# Patient Record
Sex: Male | Born: 1970 | Race: White | Hispanic: No | Marital: Married
Health system: Southern US, Academic
[De-identification: ages and names within clinical notes are randomized; demographics above are authoritative.]

## PROBLEM LIST (undated history)

## (undated) ENCOUNTER — Encounter: Attending: Sports Medicine | Primary: Sports Medicine

## (undated) ENCOUNTER — Encounter: Payer: BLUE CROSS/BLUE SHIELD | Attending: Cardiovascular Disease | Primary: Cardiovascular Disease

## (undated) ENCOUNTER — Telehealth: Attending: Sports Medicine | Primary: Sports Medicine

## (undated) ENCOUNTER — Ambulatory Visit

## (undated) ENCOUNTER — Encounter

## (undated) ENCOUNTER — Ambulatory Visit: Payer: PRIVATE HEALTH INSURANCE

## (undated) ENCOUNTER — Telehealth

## (undated) ENCOUNTER — Telehealth
Attending: Student in an Organized Health Care Education/Training Program | Primary: Student in an Organized Health Care Education/Training Program

## (undated) ENCOUNTER — Encounter: Attending: Urology | Primary: Urology

## (undated) ENCOUNTER — Encounter: Attending: Clinical | Primary: Clinical

## (undated) ENCOUNTER — Ambulatory Visit: Payer: PRIVATE HEALTH INSURANCE | Attending: Sports Medicine | Primary: Sports Medicine

## (undated) ENCOUNTER — Encounter: Attending: Internal Medicine | Primary: Internal Medicine

## (undated) ENCOUNTER — Encounter: Payer: BLUE CROSS/BLUE SHIELD | Attending: Internal Medicine | Primary: Internal Medicine

## (undated) ENCOUNTER — Telehealth: Attending: Family | Primary: Family

## (undated) ENCOUNTER — Ambulatory Visit: Payer: PRIVATE HEALTH INSURANCE | Attending: Vascular Surgery | Primary: Vascular Surgery

## (undated) ENCOUNTER — Encounter: Payer: BLUE CROSS/BLUE SHIELD | Attending: Sports Medicine | Primary: Sports Medicine

## (undated) ENCOUNTER — Encounter: Attending: Cardiovascular Disease | Primary: Cardiovascular Disease

## (undated) ENCOUNTER — Telehealth: Attending: Surgery | Primary: Surgery

## (undated) ENCOUNTER — Ambulatory Visit
Payer: PRIVATE HEALTH INSURANCE | Attending: Student in an Organized Health Care Education/Training Program | Primary: Student in an Organized Health Care Education/Training Program

## (undated) ENCOUNTER — Ambulatory Visit
Attending: Student in an Organized Health Care Education/Training Program | Primary: Student in an Organized Health Care Education/Training Program

## (undated) ENCOUNTER — Telehealth
Attending: Pharmacist Clinician (PhC)/ Clinical Pharmacy Specialist | Primary: Pharmacist Clinician (PhC)/ Clinical Pharmacy Specialist

## (undated) ENCOUNTER — Telehealth: Attending: Physician Assistant | Primary: Physician Assistant

## (undated) ENCOUNTER — Ambulatory Visit: Payer: BLUE CROSS/BLUE SHIELD | Attending: Sports Medicine | Primary: Sports Medicine

## (undated) ENCOUNTER — Ambulatory Visit: Payer: PRIVATE HEALTH INSURANCE | Attending: Specialist | Primary: Specialist

## (undated) ENCOUNTER — Encounter: Payer: BLUE CROSS/BLUE SHIELD | Attending: Anesthesiology | Primary: Anesthesiology

## (undated) ENCOUNTER — Ambulatory Visit: Payer: BLUE CROSS/BLUE SHIELD

## (undated) ENCOUNTER — Encounter: Attending: Hematology | Primary: Hematology

## (undated) ENCOUNTER — Encounter
Attending: Student in an Organized Health Care Education/Training Program | Primary: Student in an Organized Health Care Education/Training Program

## (undated) ENCOUNTER — Ambulatory Visit: Attending: Pharmacist | Primary: Pharmacist

## (undated) ENCOUNTER — Ambulatory Visit: Payer: BLUE CROSS/BLUE SHIELD | Attending: Anesthesiology | Primary: Anesthesiology

## (undated) ENCOUNTER — Inpatient Hospital Stay: Payer: PRIVATE HEALTH INSURANCE

## (undated) ENCOUNTER — Other Ambulatory Visit: Attending: Clinical | Primary: Clinical

## (undated) ENCOUNTER — Ambulatory Visit: Payer: PRIVATE HEALTH INSURANCE | Attending: Adult Health | Primary: Adult Health

## (undated) ENCOUNTER — Telehealth: Attending: Family Medicine | Primary: Family Medicine

## (undated) ENCOUNTER — Encounter: Attending: Registered" | Primary: Registered"

## (undated) ENCOUNTER — Encounter: Payer: BLUE CROSS/BLUE SHIELD | Attending: Registered" | Primary: Registered"

## (undated) ENCOUNTER — Encounter: Attending: Vascular Surgery | Primary: Vascular Surgery

## (undated) ENCOUNTER — Ambulatory Visit: Payer: Medicaid (Managed Care)

---
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
## 2007-04-30 ENCOUNTER — Ambulatory Visit (HOSPITAL_COMMUNITY): Admission: RE | Admit: 2007-04-30 | Discharge: 2007-05-03 | Payer: Self-pay | Admitting: Neurosurgery

## 2007-12-09 ENCOUNTER — Ambulatory Visit (HOSPITAL_COMMUNITY): Admission: RE | Admit: 2007-12-09 | Discharge: 2007-12-12 | Payer: Self-pay | Admitting: Neurosurgery

## 2008-02-04 ENCOUNTER — Ambulatory Visit (HOSPITAL_COMMUNITY): Admission: RE | Admit: 2008-02-04 | Discharge: 2008-02-04 | Payer: Self-pay | Admitting: Neurosurgery

## 2008-07-21 ENCOUNTER — Encounter: Admission: RE | Admit: 2008-07-21 | Discharge: 2008-07-21 | Payer: Self-pay | Admitting: Neurosurgery

## 2008-11-14 IMAGING — CR DG LUMBAR SPINE 2-3V
1 series · 1 of 1 positions shown · non-contrast
Comparison: none

CLINICAL DATA: L5-S1 microdiscectomy.
 LUMBAR SPINE - 2 VIEW:

[view not recorded]
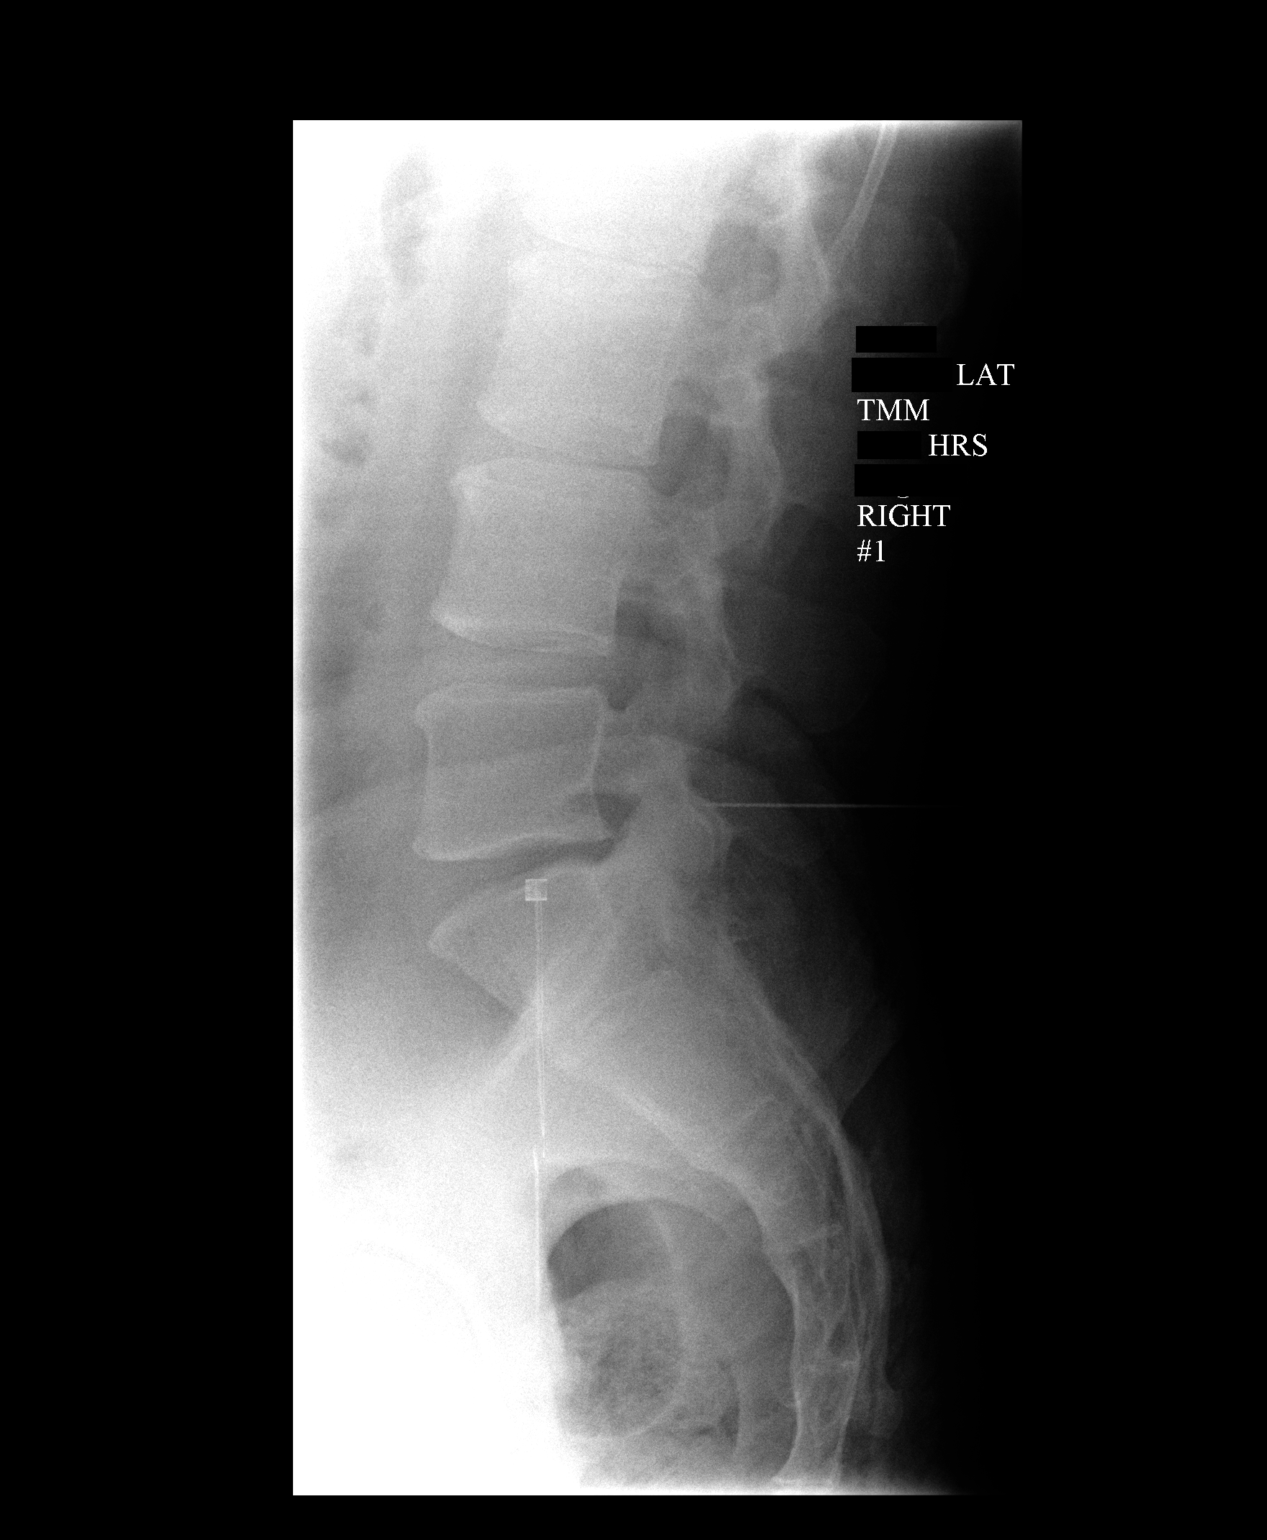

[1 of 1 positions shown; findings below may reference images not displayed]

FINDINGS: Two intraoperative crosstable lateral views of the lumbar spine are submitted. The first film taken at 1212 hours shows surgical probe tip projecting over the L5 inferior facet. Loss of disc space height and mild retrolisthesis of L5-S1. 
 A second film taken at 2791 hours shows a surgical probe tip projecting over the L5 inferior facet.
IMPRESSION: Please see above.

## 2010-03-07 DIAGNOSIS — Z8639 Personal history of other endocrine, nutritional and metabolic disease: Secondary | ICD-10-CM | POA: Insufficient documentation

## 2010-03-07 DIAGNOSIS — E291 Testicular hypofunction: Secondary | ICD-10-CM | POA: Insufficient documentation

## 2010-03-07 DIAGNOSIS — I10 Essential (primary) hypertension: Secondary | ICD-10-CM | POA: Insufficient documentation

## 2010-03-07 DIAGNOSIS — E039 Hypothyroidism, unspecified: Secondary | ICD-10-CM | POA: Insufficient documentation

## 2010-03-07 DIAGNOSIS — K219 Gastro-esophageal reflux disease without esophagitis: Secondary | ICD-10-CM | POA: Insufficient documentation

## 2010-09-10 NOTE — Op Note (Signed)
[name], Manuel Stevenson               ACCOUNT NO.:  [id number]   MEDICAL RECORD NO.:  [id number]          PATIENT TYPE:  AMB   LOCATION:  SDS                          FACILITY:  [facility]   PHYSICIAN:  Reinaldo Meeker, M.D. DATE OF BIRTH:  1970/05/13   DATE OF PROCEDURE:  04/30/2007  DATE OF DISCHARGE:                               OPERATIVE REPORT   PREOPERATIVE DIAGNOSIS:  Herniated disk L5-S1 left.   POSTOPERATIVE DIAGNOSIS:  Herniated disk L5-S1 left.   PROCEDURE:  Left L5-S1 interlaminar laminotomy for excision of herniated  disk with operative microscope.   SECONDARY PROCEDURE:  Microdissection L5-S1 disk and S1 nerve root.   SURGEON:  Reinaldo Meeker, M.D.   ASSISTANT:  Tia Alert, MD   PROCEDURE IN DETAIL:  After being placed in the prone position the  patient's back was prepped and draped in the usual sterile fashion.  Localizing x-rays taken prior to incision to identify the appropriate  level.  Midline incision was made above the spinous processes of L5 and  S1.  Using Bovie cutting current, the incision was carried down the  spinous processes.  Subperiosteal dissection was then carried out on the  left-sided spinous processes and lamina.  Self-retaining retractor was  placed for exposure.  X-rays showed approach to the appropriate level.  Using a high-speed drill the inferior one-third of the L5 laminae and  medial one-third of facet joint and the superior one-third of the S1  lamina were removed.  Residual bone and ligamentum flavum were removed  in a piecemeal fashion.  Microscope was draped and brought in the field  used for remainder of the case.  Using microsection technique, the  lateral aspect of thecal sac and S1 nerve were identified.  Further  coagulation was carried down to floor of canal to identify the L5-S1  disk which was found to be herniated medially to the nerve root.  After  coagulating the annulus, the annulus was incised with a 15 blade.  Using  pituitary rongeurs and curettes thorough disk space clean-out was  carried out.  At same time great care was taken avoid injury to the  neural elements.  This was successfully done.  At this point inspection  was carried out in all directions for any evidence of residual  compression and none could be identified.  Large amounts of irrigation  carried out.  Any bleeding controlled with bipolar coagulation and  Gelfoam.  The wound was then closed in multiple layers of Vicryl in the  muscle, fascia, subcutaneous and subcuticular tissues and staples were  placed on the skin.  A sterile dressing was then applied.  The patient  was extubated, taken to recovery room in stable condition.           ______________________________  Reinaldo Meeker, M.D.     ROK/MEDQ  D:  04/30/2007  T:  04/30/2007  Job:  [id number]

## 2010-09-10 NOTE — Op Note (Signed)
NAMEBREWSTER, [name]:  [id number]   MEDICAL RECORD NO.:  [id number]          PATIENT TYPE:  INP   LOCATION:  3172                         FACILITY:  [facility]   PHYSICIAN:  Reinaldo Meeker, M.D. DATE OF BIRTH:  May 22, 1970   DATE OF PROCEDURE:  12/09/2007  DATE OF DISCHARGE:                               OPERATIVE REPORT   PREOPERATIVE DIAGNOSES:  Herniated disk and degenerative disk disease L5-  S1.   POSTOPERATIVE DIAGNOSES:  Herniated disk and degenerative disk disease  L5-S1.   PROCEDURES:  Bilateral L5-S1 decompressive laminectomy followed by  bilateral microdiskectomy followed by posterior lumbar interbody fusion  L5-S1 with invasive bony spacer and PEEK interbody cage followed by  nonsegmental instrumentation L5-S1 with transfacet screw fixation  followed by posterior fusion L5-S1.   SECONDARY PROCEDURE:  Microdissection L5-S1 disk and S1 nerve roots  bilaterally and intraoperative EMG monitoring.   SURGEON:  Reinaldo Meeker, MD.   ASSISTANT:  Tia Alert, MD   PROCEDURE IN DETAIL:  After placed in the prone position, the patient's  back was prepped and draped in usual sterile fashion.  Previous lumbar  incision was opened up and extended slightly in both directions.  Incision was carried down the spinous processes.  Subperiosteal  dissection was then carried out bilaterally on the spinous processes and  lamina facet joint, and self-retaining retractor was placed for  exposure.  X-ray showed approach to the appropriate level.  Spinous  processes of the interspinous ligament were removed.  Starting on the  patient's right side, laminotomy was performed by removing the inferior  one-half of the L5 lamina and medial one-third of the facet joint and  superior one-half of the S1 lamina.  Residual bone was removed and saved  for use later in the case.  Ligamentum flavum was removed in a piecemeal  fashion.  On the opposite side, edges of the  previous laminotomy were  identified and slightly enlarged to allow access to the left-sided  thecal sac and S1 nerve root.  Dissection was carried out down the floor  of the canal to identify the L5-S1 disk.  This was incised bilaterally  and thoroughly cleaned out with pituitary rongeurs and curettes.  This  was done bilaterally.  At this time, a 10-mm distractor was placed on  the patient's left side.  This was found to be in good height.  On the  opposite side, rotating cutter was used followed by a box chisel with  the 10 x 9 mm chisel and then a 10 x 9 x 25 mm PEEK cage was placed  without difficulty and fluoroscopy showed to be in good position.  Distractor was removed on the opposite side.  On that side, 10-mm  rotating cutter was used.  Prior to placing the second spacer,  autologous bone and OsteoSet Plus were placed in the midline with the  help of the interbody fusion.  On this side, an invasive bony spacer was  placed in an insert and rotate style without difficulty and fluoroscopy  showed to be in excellent position.  At this  time, transfixing screw  fixation was carried out from the patient's right side.  Drill hole was  made followed by passing the drill over the guide.  EMG testing along  with palpation of the medial pedicle, and AP and lateral fluoroscopy  showed no breach of the pedicle.  Tap was then carried out followed by  placing the 35-mm screw into good position.  On the opposite side,  similar procedure was carried out.  At this time, a 38-mm screw was  used.  Once again, an EMG testing was done along with palpation of the  medial pedicle to confirm no breach of the pedicle.  Final fluoroscopy  in AP and lateral direction showed excellent placement of the screws and  interbody devices.  Large amount of irrigation were carried out.  High-  speed was used decorticate the facet joint, autologous bone and OsteoSet  Plus were placed for the posterior fusion.  Gelfoam  was placed around  the thecal sac after irrigation was carried out once more.  Epidural  drain was left in the epidural space and brought out through a separate  stab incision.  The wound was then closed in multiple layers of Vicryl  in the muscle fascia, subcutaneous tissues and staples were placed on  the skin.  A sterile dressing was then applied, and the patient was  extubated and taken to recovery room in stable condition.           ______________________________  Reinaldo Meeker, M.D.     ROK/MEDQ  D:  12/09/2007  T:  12/10/2007  Job:  [id number]

## 2011-01-24 LAB — BASIC METABOLIC PANEL
BUN: 9
CO2: 27
Calcium: 9.4
GFR calc Af Amer: >60
GFR calc non Af Amer: >60
Glucose, Bld: 104 — ABNORMAL HIGH

## 2011-01-24 LAB — CBC
HCT: 46.4
Hemoglobin: 16
MCV: 94
Platelets: 323
RBC: 4.94
RDW: 12.7
WBC: 8.1

## 2011-01-24 LAB — TYPE AND SCREEN

## 2011-01-31 LAB — CBC
Hemoglobin: 15.8
RBC: 4.95
RDW: 12.9

## 2011-01-31 LAB — BASIC METABOLIC PANEL
Calcium: 9.7
GFR calc Af Amer: >60
Potassium: 4.3

## 2011-04-18 DIAGNOSIS — M6740 Ganglion, unspecified site: Secondary | ICD-10-CM | POA: Insufficient documentation

## 2011-04-18 DIAGNOSIS — M19049 Primary osteoarthritis, unspecified hand: Secondary | ICD-10-CM | POA: Insufficient documentation

## 2011-12-12 DIAGNOSIS — L03039 Cellulitis of unspecified toe: Secondary | ICD-10-CM | POA: Insufficient documentation

## 2012-06-07 DIAGNOSIS — G250 Essential tremor: Secondary | ICD-10-CM | POA: Insufficient documentation

## 2012-08-16 DIAGNOSIS — M542 Cervicalgia: Secondary | ICD-10-CM | POA: Insufficient documentation

## 2012-10-20 DIAGNOSIS — D869 Sarcoidosis, unspecified: Secondary | ICD-10-CM | POA: Insufficient documentation

## 2013-03-11 DIAGNOSIS — M961 Postlaminectomy syndrome, not elsewhere classified: Secondary | ICD-10-CM | POA: Insufficient documentation

## 2013-03-18 DIAGNOSIS — N209 Urinary calculus, unspecified: Secondary | ICD-10-CM | POA: Insufficient documentation

## 2013-03-22 DIAGNOSIS — G8929 Other chronic pain: Secondary | ICD-10-CM | POA: Insufficient documentation

## 2013-03-22 DIAGNOSIS — M549 Dorsalgia, unspecified: Secondary | ICD-10-CM

## 2013-06-09 DIAGNOSIS — Z9689 Presence of other specified functional implants: Secondary | ICD-10-CM | POA: Insufficient documentation

## 2013-07-06 DIAGNOSIS — IMO0002 Reserved for concepts with insufficient information to code with codable children: Secondary | ICD-10-CM | POA: Insufficient documentation

## 2013-09-28 DIAGNOSIS — R002 Palpitations: Secondary | ICD-10-CM | POA: Insufficient documentation

## 2014-10-04 DIAGNOSIS — I2510 Atherosclerotic heart disease of native coronary artery without angina pectoris: Secondary | ICD-10-CM | POA: Insufficient documentation

## 2014-10-04 DIAGNOSIS — Z8679 Personal history of other diseases of the circulatory system: Secondary | ICD-10-CM | POA: Insufficient documentation

## 2015-03-25 DIAGNOSIS — J0110 Acute frontal sinusitis, unspecified: Secondary | ICD-10-CM | POA: Insufficient documentation

## 2016-03-18 DIAGNOSIS — N529 Male erectile dysfunction, unspecified: Secondary | ICD-10-CM | POA: Insufficient documentation

## 2016-03-18 DIAGNOSIS — R399 Unspecified symptoms and signs involving the genitourinary system: Secondary | ICD-10-CM | POA: Insufficient documentation

## 2016-11-11 ENCOUNTER — Emergency Department
Admission: EM | Admit: 2016-11-11 | Discharge: 2016-11-11 | Disposition: A | Discharge status: Home / Self Care | Payer: Commercial Managed Care - PPO | Source: Intra-hospital

## 2016-11-11 ENCOUNTER — Emergency Department
Admission: EM | Admit: 2016-11-11 | Discharge: 2016-11-11 | Disposition: A | Discharge status: Home / Self Care | Payer: Commercial Managed Care - PPO | Source: Intra-hospital | Attending: Family Medicine | Admitting: Family Medicine

## 2016-11-11 DIAGNOSIS — R109 Unspecified abdominal pain: Principal | ICD-10-CM

## 2017-01-13 ENCOUNTER — Ambulatory Visit: Admission: RE | Admit: 2017-01-13 | Discharge: 2017-01-13 | Disposition: A | Discharge status: Home / Self Care

## 2017-01-13 DIAGNOSIS — E291 Testicular hypofunction: Principal | ICD-10-CM

## 2017-01-13 DIAGNOSIS — N529 Male erectile dysfunction, unspecified: Secondary | ICD-10-CM

## 2017-01-13 DIAGNOSIS — Z125 Encounter for screening for malignant neoplasm of prostate: Secondary | ICD-10-CM

## 2017-01-13 MED ORDER — TESTOSTERONE CYPIONATE 200 MG/ML INTRAMUSCULAR OIL
INTRAMUSCULAR | 1 refills | 0.00000 days | Status: CP
Start: 2017-01-13 — End: 2017-02-12

## 2017-01-13 MED ORDER — SYRINGE WITH NEEDLE 3 ML 23 GAUGE X 1 1/2"
INJECTION | 11 refills | 0.00000 days | Status: CP
Start: 2017-01-13 — End: 2017-09-15

## 2017-01-27 ENCOUNTER — Ambulatory Visit
Admission: RE | Admit: 2017-01-27 | Discharge: 2017-01-27 | Disposition: A | Discharge status: Home / Self Care | Attending: Sports Medicine | Admitting: Sports Medicine

## 2017-01-27 DIAGNOSIS — G4489 Other headache syndrome: Secondary | ICD-10-CM | POA: Insufficient documentation

## 2017-01-27 DIAGNOSIS — G4733 Obstructive sleep apnea (adult) (pediatric): Secondary | ICD-10-CM | POA: Insufficient documentation

## 2017-01-27 DIAGNOSIS — I2510 Atherosclerotic heart disease of native coronary artery without angina pectoris: Principal | ICD-10-CM

## 2017-01-27 DIAGNOSIS — I10 Essential (primary) hypertension: Secondary | ICD-10-CM

## 2017-01-27 DIAGNOSIS — N529 Male erectile dysfunction, unspecified: Secondary | ICD-10-CM

## 2017-01-27 MED ORDER — SILDENAFIL (PULMONARY HYPERTENSION) 20 MG TABLET
ORAL_TABLET | 5 refills | 0 days | Status: CP
Start: 2017-01-27 — End: 2018-01-01

## 2017-02-03 ENCOUNTER — Ambulatory Visit: Admission: RE | Admit: 2017-02-03 | Discharge: 2017-02-03 | Attending: Sports Medicine

## 2017-02-03 DIAGNOSIS — E7849 Other hyperlipidemia: Secondary | ICD-10-CM | POA: Insufficient documentation

## 2017-02-03 DIAGNOSIS — Z23 Encounter for immunization: Secondary | ICD-10-CM | POA: Insufficient documentation

## 2017-02-03 DIAGNOSIS — I10 Essential (primary) hypertension: Principal | ICD-10-CM

## 2017-02-03 DIAGNOSIS — I2510 Atherosclerotic heart disease of native coronary artery without angina pectoris: Secondary | ICD-10-CM

## 2017-02-03 DIAGNOSIS — E039 Hypothyroidism, unspecified: Secondary | ICD-10-CM

## 2017-02-03 MED ORDER — ATORVASTATIN 20 MG TABLET
ORAL_TABLET | Freq: Every day | ORAL | 5 refills | 0.00000 days | Status: CP
Start: 2017-02-03 — End: 2018-01-01

## 2017-02-03 MED ORDER — HYDROCHLOROTHIAZIDE 25 MG TABLET
ORAL_TABLET | Freq: Every morning | ORAL | 5 refills | 0 days | Status: CP
Start: 2017-02-03 — End: 2017-11-18

## 2017-02-03 MED ORDER — LISINOPRIL 10 MG TABLET
ORAL_TABLET | Freq: Every day | ORAL | 3 refills | 0 days | Status: CP
Start: 2017-02-03 — End: 2018-01-01

## 2017-02-03 MED ORDER — LEVOTHYROXINE 112 MCG TABLET
ORAL_TABLET | Freq: Every day | ORAL | 5 refills | 0 days | Status: CP
Start: 2017-02-03 — End: 2018-02-03

## 2017-02-10 ENCOUNTER — Ambulatory Visit
Admission: RE | Admit: 2017-02-10 | Discharge: 2017-02-10 | Disposition: A | Discharge status: Home / Self Care | Attending: Urology

## 2017-02-10 DIAGNOSIS — N4889 Other specified disorders of penis: Principal | ICD-10-CM

## 2017-04-23 MED ORDER — CARVEDILOL 6.25 MG TABLET
ORAL_TABLET | Freq: Two times a day (BID) | ORAL | 3 refills | 0 days | Status: CP
Start: 2017-04-23 — End: 2018-01-01

## 2017-05-05 ENCOUNTER — Encounter: Admit: 2017-05-05 | Discharge: 2017-05-06 | Payer: BLUE CROSS/BLUE SHIELD

## 2017-05-05 DIAGNOSIS — R7989 Other specified abnormal findings of blood chemistry: Secondary | ICD-10-CM | POA: Insufficient documentation

## 2017-05-05 DIAGNOSIS — N509 Disorder of male genital organs, unspecified: Secondary | ICD-10-CM

## 2017-05-05 MED ORDER — TESTOSTERONE CYPIONATE 200 MG/ML INTRAMUSCULAR OIL
INTRAMUSCULAR | 1 refills | 0.00000 days | Status: CP
Start: 2017-05-05 — End: 2017-09-15

## 2017-05-22 DIAGNOSIS — N5089 Other specified disorders of the male genital organs: Principal | ICD-10-CM

## 2017-05-25 ENCOUNTER — Encounter
Admit: 2017-05-25 | Discharge: 2017-05-25 | Payer: BLUE CROSS/BLUE SHIELD | Attending: Certified Registered" | Primary: Certified Registered"

## 2017-05-25 ENCOUNTER — Encounter: Admit: 2017-05-25 | Discharge: 2017-05-25 | Payer: BLUE CROSS/BLUE SHIELD

## 2017-05-25 DIAGNOSIS — N5089 Other specified disorders of the male genital organs: Principal | ICD-10-CM

## 2017-05-25 MED ORDER — HYDROCODONE 5 MG-ACETAMINOPHEN 325 MG TABLET
ORAL_TABLET | Freq: Four times a day (QID) | ORAL | 0 refills | 0.00000 days | Status: CP | PRN
Start: 2017-05-25 — End: 2017-05-25

## 2017-05-25 MED ORDER — MELOXICAM 7.5 MG TABLET
ORAL_TABLET | Freq: Two times a day (BID) | ORAL | 0 refills | 0.00000 days | Status: CP
Start: 2017-05-25 — End: 2017-05-25

## 2017-05-25 MED ORDER — MELOXICAM 7.5 MG TABLET: 8 mg | tablet | Freq: Two times a day (BID) | 0 refills | 0 days | Status: AC

## 2017-05-25 MED FILL — MELOXICAM/7.5MG/TABS: MELOXICAM/7.5MG/TABS | 14 days supply | Qty: 28 | Fill #0

## 2017-06-17 ENCOUNTER — Encounter: Admit: 2017-06-17 | Discharge: 2017-06-18 | Payer: BLUE CROSS/BLUE SHIELD

## 2017-06-17 DIAGNOSIS — Q5569 Other congenital malformation of penis: Principal | ICD-10-CM

## 2017-09-15 ENCOUNTER — Encounter: Admit: 2017-09-15 | Discharge: 2017-09-16 | Payer: BLUE CROSS/BLUE SHIELD

## 2017-09-15 DIAGNOSIS — R7989 Other specified abnormal findings of blood chemistry: Principal | ICD-10-CM

## 2017-09-15 DIAGNOSIS — E7849 Other hyperlipidemia: Secondary | ICD-10-CM

## 2017-09-15 DIAGNOSIS — E291 Testicular hypofunction: Secondary | ICD-10-CM

## 2017-09-15 DIAGNOSIS — E039 Hypothyroidism, unspecified: Secondary | ICD-10-CM

## 2017-09-15 MED ORDER — SYRINGE WITH NEEDLE 3 ML 23 GAUGE X 1 1/2"
INJECTION | 11 refills | 0 days | Status: CP
Start: 2017-09-15 — End: 2018-09-02

## 2017-09-15 MED ORDER — TESTOSTERONE CYPIONATE 200 MG/ML INTRAMUSCULAR OIL
INTRAMUSCULAR | 1 refills | 0 days | Status: CP
Start: 2017-09-15 — End: 2018-03-16

## 2017-10-01 ENCOUNTER — Encounter: Admit: 2017-10-01 | Discharge: 2017-10-02 | Payer: BLUE CROSS/BLUE SHIELD | Attending: Urology | Primary: Urology

## 2017-10-01 DIAGNOSIS — N360 Urethral fistula: Secondary | ICD-10-CM | POA: Insufficient documentation

## 2017-11-18 ENCOUNTER — Encounter: Admit: 2017-11-18 | Discharge: 2017-11-19 | Payer: BLUE CROSS/BLUE SHIELD

## 2017-11-18 DIAGNOSIS — E785 Hyperlipidemia, unspecified: Secondary | ICD-10-CM

## 2017-11-18 DIAGNOSIS — I2510 Atherosclerotic heart disease of native coronary artery without angina pectoris: Secondary | ICD-10-CM

## 2017-11-18 DIAGNOSIS — I10 Essential (primary) hypertension: Secondary | ICD-10-CM

## 2017-11-18 DIAGNOSIS — Z01818 Encounter for other preprocedural examination: Principal | ICD-10-CM

## 2017-12-01 ENCOUNTER — Encounter
Admit: 2017-12-01 | Discharge: 2017-12-01 | Payer: BLUE CROSS/BLUE SHIELD | Attending: Certified Registered" | Primary: Certified Registered"

## 2017-12-01 ENCOUNTER — Encounter: Admit: 2017-12-01 | Discharge: 2017-12-01 | Payer: BLUE CROSS/BLUE SHIELD

## 2017-12-01 DIAGNOSIS — N360 Urethral fistula: Principal | ICD-10-CM

## 2017-12-01 MED ORDER — OXYCODONE 5 MG TABLET
ORAL_TABLET | ORAL | 0 refills | 0.00000 days | Status: CP | PRN
Start: 2017-12-01 — End: 2017-12-01

## 2017-12-01 MED ORDER — SENNOSIDES 8.6 MG TABLET
ORAL_TABLET | Freq: Every day | ORAL | 0 refills | 0.00000 days | Status: CP
Start: 2017-12-01 — End: 2017-12-31

## 2017-12-01 MED ORDER — TRAMADOL 50 MG TABLET
ORAL_TABLET | Freq: Four times a day (QID) | ORAL | 0 refills | 0 days | Status: CP
Start: 2017-12-01 — End: 2017-12-04

## 2017-12-01 MED ORDER — DOCUSATE SODIUM 100 MG CAPSULE
ORAL_CAPSULE | Freq: Two times a day (BID) | ORAL | 0 refills | 0 days | Status: CP
Start: 2017-12-01 — End: 2017-12-31

## 2017-12-01 MED ORDER — KETOROLAC 10 MG TABLET
ORAL_TABLET | Freq: Four times a day (QID) | ORAL | 0 refills | 0 days | Status: CP | PRN
Start: 2017-12-01 — End: 2017-12-06

## 2017-12-07 ENCOUNTER — Encounter: Admit: 2017-12-07 | Discharge: 2017-12-08 | Payer: BLUE CROSS/BLUE SHIELD

## 2017-12-07 DIAGNOSIS — N360 Urethral fistula: Principal | ICD-10-CM

## 2018-01-01 ENCOUNTER — Encounter
Admit: 2018-01-01 | Discharge: 2018-01-02 | Payer: BLUE CROSS/BLUE SHIELD | Attending: Sports Medicine | Primary: Sports Medicine

## 2018-01-01 DIAGNOSIS — H60501 Unspecified acute noninfective otitis externa, right ear: Secondary | ICD-10-CM | POA: Insufficient documentation

## 2018-01-01 DIAGNOSIS — I2510 Atherosclerotic heart disease of native coronary artery without angina pectoris: Secondary | ICD-10-CM

## 2018-01-01 DIAGNOSIS — N529 Male erectile dysfunction, unspecified: Secondary | ICD-10-CM

## 2018-01-01 DIAGNOSIS — Z23 Encounter for immunization: Secondary | ICD-10-CM

## 2018-01-01 DIAGNOSIS — E7849 Other hyperlipidemia: Principal | ICD-10-CM

## 2018-01-01 MED ORDER — CARVEDILOL 6.25 MG TABLET
ORAL_TABLET | Freq: Two times a day (BID) | ORAL | 3 refills | 0 days | Status: CP
Start: 2018-01-01 — End: ?

## 2018-01-01 MED ORDER — LISINOPRIL 10 MG TABLET
ORAL_TABLET | Freq: Every day | ORAL | 3 refills | 0 days | Status: CP
Start: 2018-01-01 — End: 2018-01-14

## 2018-01-01 MED ORDER — SILDENAFIL (PULMONARY HYPERTENSION) 20 MG TABLET
ORAL_TABLET | 5 refills | 0 days | Status: CP
Start: 2018-01-01 — End: 2018-03-14

## 2018-01-01 MED ORDER — ATORVASTATIN 20 MG TABLET
ORAL_TABLET | Freq: Every day | ORAL | 3 refills | 0 days | Status: CP
Start: 2018-01-01 — End: 2018-01-14

## 2018-01-01 MED ORDER — NEOMYCIN-POLYMYXIN-HYDROCORTISONE OTIC SUSP T-HOME
Freq: Four times a day (QID) | OTIC | 0 refills | 0 days | Status: CP
Start: 2018-01-01 — End: 2018-01-11

## 2018-01-14 ENCOUNTER — Encounter
Admit: 2018-01-14 | Discharge: 2018-01-15 | Payer: BLUE CROSS/BLUE SHIELD | Attending: Internal Medicine | Primary: Internal Medicine

## 2018-01-14 DIAGNOSIS — I209 Angina pectoris, unspecified: Principal | ICD-10-CM

## 2018-01-14 DIAGNOSIS — E785 Hyperlipidemia, unspecified: Secondary | ICD-10-CM

## 2018-01-14 DIAGNOSIS — I10 Essential (primary) hypertension: Secondary | ICD-10-CM

## 2018-01-14 DIAGNOSIS — I2510 Atherosclerotic heart disease of native coronary artery without angina pectoris: Secondary | ICD-10-CM

## 2018-01-14 MED ORDER — ROSUVASTATIN 20 MG TABLET
ORAL_TABLET | Freq: Every day | ORAL | 3 refills | 0.00000 days | Status: CP
Start: 2018-01-14 — End: ?

## 2018-01-14 MED ORDER — LISINOPRIL 10 MG TABLET
ORAL_TABLET | Freq: Two times a day (BID) | ORAL | 3 refills | 0 days | Status: CP
Start: 2018-01-14 — End: ?

## 2018-01-14 MED ORDER — NITROGLYCERIN 0.4 MG SUBLINGUAL TABLET
ORAL_TABLET | SUBLINGUAL | 3 refills | 0.00000 days | Status: CP | PRN
Start: 2018-01-14 — End: 2018-03-14

## 2018-01-15 DIAGNOSIS — I209 Angina pectoris, unspecified: Secondary | ICD-10-CM | POA: Insufficient documentation

## 2018-02-04 ENCOUNTER — Encounter: Admit: 2018-02-04 | Discharge: 2018-02-05 | Payer: BLUE CROSS/BLUE SHIELD

## 2018-02-04 DIAGNOSIS — I2510 Atherosclerotic heart disease of native coronary artery without angina pectoris: Secondary | ICD-10-CM

## 2018-02-04 DIAGNOSIS — I209 Angina pectoris, unspecified: Principal | ICD-10-CM

## 2018-02-12 ENCOUNTER — Ambulatory Visit: Admit: 2018-02-12 | Discharge: 2018-02-12 | Payer: BLUE CROSS/BLUE SHIELD

## 2018-02-12 MED ORDER — ISOSORBIDE MONONITRATE ER 30 MG TABLET,EXTENDED RELEASE 24 HR
ORAL_TABLET | Freq: Every day | ORAL | 1 refills | 0 days | Status: CP
Start: 2018-02-12 — End: 2018-03-14

## 2018-03-08 ENCOUNTER — Encounter: Admit: 2018-03-08 | Discharge: 2018-03-08 | Payer: BLUE CROSS/BLUE SHIELD | Attending: Urology | Primary: Urology

## 2018-03-08 DIAGNOSIS — N360 Urethral fistula: Principal | ICD-10-CM

## 2018-03-14 MED ORDER — SILDENAFIL 100 MG TABLET
ORAL_TABLET | Freq: Every day | ORAL | 11 refills | 0 days | Status: CP | PRN
Start: 2018-03-14 — End: 2019-03-14

## 2018-03-16 ENCOUNTER — Encounter: Admit: 2018-03-16 | Discharge: 2018-03-17 | Payer: BLUE CROSS/BLUE SHIELD

## 2018-03-16 DIAGNOSIS — R7989 Other specified abnormal findings of blood chemistry: Principal | ICD-10-CM

## 2018-03-16 DIAGNOSIS — Z6834 Body mass index (BMI) 34.0-34.9, adult: Secondary | ICD-10-CM

## 2018-03-16 MED ORDER — TADALAFIL 5 MG TABLET
ORAL_TABLET | Freq: Every day | ORAL | 11 refills | 0 days | Status: CP
Start: 2018-03-16 — End: 2018-09-02

## 2018-03-16 MED ORDER — TESTOSTERONE CYPIONATE 200 MG/ML INTRAMUSCULAR OIL
INTRAMUSCULAR | 1 refills | 0 days | Status: CP
Start: 2018-03-16 — End: 2018-09-02

## 2018-08-30 ENCOUNTER — Ambulatory Visit (INDEPENDENT_AMBULATORY_CARE_PROVIDER_SITE_OTHER): Payer: 59 | Admitting: Podiatry

## 2018-08-30 DIAGNOSIS — S92061A Displaced intraarticular fracture of right calcaneus, initial encounter for closed fracture: Secondary | ICD-10-CM

## 2018-08-30 DIAGNOSIS — W11XXXA Fall on and from ladder, initial encounter: Secondary | ICD-10-CM

## 2018-08-30 DIAGNOSIS — M79671 Pain in right foot: Secondary | ICD-10-CM | POA: Diagnosis not present

## 2018-08-30 MED ORDER — ONDANSETRON HCL 4 MG PO TABS: 4.0000 mg | ORAL_TABLET | Freq: Three times a day (TID) | ORAL | 0 refills | Status: DC | PRN

## 2018-08-30 MED ORDER — OXYCODONE HCL 5 MG PO TABS: 5.0000 mg | ORAL_TABLET | ORAL | 0 refills | Status: DC | PRN

## 2018-08-30 NOTE — Progress Notes (Signed)
  Subjective:  Patient ID: Manuel Stevenson, male    DOB: 1970/12/09,  MRN: [id number]  No chief complaint on file.  48 y.o. male presents with the above complaint.  Reports fall from a ladder caused him to land on his foot.  States the injury occurred yesterday.  Was seen at [facility] and told he had a fracture. Underwent a CT scan.  States he fell greater than 12 feet while changing a light bulb.  Review of Systems: Negative except as noted in the HPI. Denies N/V/F/Ch.  No past medical history on file.  Current Outpatient Medications:  .  ondansetron (ZOFRAN) 4 MG tablet, Take 1 tablet (4 mg total) by mouth every 8 (eight) hours as needed for nausea or vomiting., Disp: 20 tablet, Rfl: 0 .  oxyCODONE (ROXICODONE) 5 MG immediate release tablet, Take 1 tablet (5 mg total) by mouth every 4 (four) hours as needed for severe pain., Disp: 20 tablet, Rfl: 0  Social History   Tobacco Use  Smoking Status Not on file    Allergies not on file Objective:  There were no vitals filed for this visit. There is no height or weight on file to calculate BMI. Constitutional Well developed. Well nourished.  Vascular Dorsalis pedis pulses palpable bilaterally. Posterior tibial pulses palpable bilaterally. Capillary refill normal to all digits.  No cyanosis or clubbing noted. Pedal hair growth normal.  Neurologic Normal speech. Oriented to person, place, and time. Epicritic sensation to light touch grossly present bilaterally.  Dermatologic Nails well groomed and normal in appearance. No open wounds. No skin lesions.  Orthopedic:  Pain to palpation about the right calcaneus.  Edema contusion noted to the right heel   Radiographs: Prior CT reviewed comminuted displaced fracture of the right calcaneus.  Moderate displacement no other acute fractures Assessment:   1. Closed displaced intra-articular fracture of right calcaneus, initial encounter   2. Fall from ladder, initial encounter   3.  Pain of right heel    Plan:  Patient was evaluated and treated and all questions answered.  Right calcaneus fracture -CT reviewed. -Discussed with patient operative intervention for this injury.  Discussed risks of the treatment including delayed healing of the fracture, wound healing issues.  Discussed that while surgery is not tended to decrease the risk of motor posttraumatic arthritis, discussed that posttraumatic arthritis is common after this injury even with surgical treatment per the literature. -Knee scooter ordered for safe nonweightbearing to the right lower extremity -Patient has failed all conservative therapy and wishes to proceed with surgical intervention. All risks, benefits, and alternatives discussed with patient. No guarantees given. Consent reviewed and signed by patient. -Planned procedures: ORIF R Calcaneus fracture    45 minutes of face to face time were spent with the patient. >50% of this was spent on counseling and coordination of care. Specifically discussed with patient the above diagnoses and plan for surgery.  No follow-ups on file.

## 2018-08-31 NOTE — Patient Instructions (Signed)
Pre-Operative Instructions  Congratulations, you have decided to take an important step towards improving your quality of life.  You can be assured that the doctors and staff at [facility] will be with you every step of the way.  Here are some important things you should know:  1. Plan to be at the surgery center/hospital at least 1 (one) hour prior to your scheduled time, unless otherwise directed by the surgical center/hospital staff.  You must have a responsible adult accompany you, remain during the surgery and drive you home.  Make sure you have directions to the surgical center/hospital to ensure you arrive on time. 2. If you are having surgery at [facility] or  hospitals, you will need a copy of your medical history and physical form from your family physician within one month prior to the date of surgery. We will give you a form for your primary physician to complete.  3. We make every effort to accommodate the date you request for surgery.  However, there are times where surgery dates or times have to be moved.  We will contact you as soon as possible if a change in schedule is required.   4. No aspirin/ibuprofen for one week before surgery.  If you are on aspirin, any non-steroidal anti-inflammatory medications (Mobic, Aleve, Ibuprofen) should not be taken seven (7) days prior to your surgery.  You make take Tylenol for pain prior to surgery.  5. Medications - If you are taking daily heart and blood pressure medications, seizure, reflux, allergy, asthma, anxiety, pain or diabetes medications, make sure you notify the surgery center/hospital before the day of surgery so they can tell you which medications you should take or avoid the day of surgery. 6. No food or drink after midnight the night before surgery unless directed otherwise by surgical center/hospital staff. 7. No alcoholic beverages 24-hours prior to surgery.  No smoking 24-hours prior or 24-hours after  surgery. 8. Wear loose pants or shorts. They should be loose enough to fit over bandages, boots, and casts. 9. Don't wear slip-on shoes. Sneakers are preferred. 10. Bring your boot with you to the surgery center/hospital.  Also bring crutches or a walker if your physician has prescribed it for you.  If you do not have this equipment, it will be provided for you after surgery. 11. If you have not been contacted by the surgery center/hospital by the day before your surgery, call to confirm the date and time of your surgery. 12. Leave-time from work may vary depending on the type of surgery you have.  Appropriate arrangements should be made prior to surgery with your employer. 13. Prescriptions will be provided immediately following surgery by your doctor.  Fill these as soon as possible after surgery and take the medication as directed. Pain medications will not be refilled on weekends and must be approved by the doctor. 14. Remove nail polish on the operative foot and avoid getting pedicures prior to surgery. 15. Wash the night before surgery.  The night before surgery wash the foot and leg well with water and the antibacterial soap provided. Be sure to pay special attention to beneath the toenails and in between the toes.  Wash for at least three (3) minutes. Rinse thoroughly with water and dry well with a towel.  Perform this wash unless told not to do so by your physician.  Enclosed: 1 Ice pack (please put in freezer the night before surgery)   1 Hibiclens skin cleaner     Pre-op instructions  If you have any questions regarding the instructions, please do not hesitate to call our office.  [place]: [address], [place], [place] [address] -- [phone]  [place]: [address] , [place] [address] -- [phone]  [place]: [address]  [place], [place] [address] -- [phone]  [place]: [address], [address], [place], [place] [address] -- [phone]  Website:  [email or web address] 

## 2018-09-01 ENCOUNTER — Telehealth: Payer: Self-pay | Admitting: *Deleted

## 2018-09-01 NOTE — Telephone Encounter (Signed)
"  I was seen at the [facility] office by Dr. Samuella Cota.  I have got to have foot surgery next Wednesday.  He wants to do it on Wednesday.  He's got to put in metal plates and screws where I crushed my foot in a fall Sunday evening.  If you would, just give me a call back at my home so we can get the surgery scheduled."  (please send paperwork)

## 2018-09-02 ENCOUNTER — Other Ambulatory Visit: Payer: Self-pay | Admitting: *Deleted

## 2018-09-02 ENCOUNTER — Encounter: Admit: 2018-09-02 | Discharge: 2018-09-03 | Payer: BLUE CROSS/BLUE SHIELD

## 2018-09-02 DIAGNOSIS — S92061A Displaced intraarticular fracture of right calcaneus, initial encounter for closed fracture: Secondary | ICD-10-CM

## 2018-09-02 DIAGNOSIS — R7989 Other specified abnormal findings of blood chemistry: Principal | ICD-10-CM

## 2018-09-02 DIAGNOSIS — N529 Male erectile dysfunction, unspecified: Secondary | ICD-10-CM

## 2018-09-02 DIAGNOSIS — E291 Testicular hypofunction: Secondary | ICD-10-CM

## 2018-09-02 MED ORDER — TESTOSTERONE CYPIONATE 200 MG/ML INTRAMUSCULAR OIL
INTRAMUSCULAR | 1 refills | 0 days | Status: CP
Start: 2018-09-02 — End: ?

## 2018-09-02 MED ORDER — SYRINGE WITH NEEDLE 3 ML 23 GAUGE X 1 1/2"
INJECTION | 11 refills | 0 days | Status: CP
Start: 2018-09-02 — End: ?

## 2018-09-02 MED ORDER — TADALAFIL 5 MG TABLET
ORAL_TABLET | Freq: Every day | ORAL | 3 refills | 0 days | Status: CP
Start: 2018-09-02 — End: 2019-08-28

## 2018-09-02 NOTE — Progress Notes (Signed)
Per Dr. Samuella Cota, I placed an order for a knee scooter.  I contacted Georgia Lopes from [facility], so she can assist Manuel Stevenson in getting the knee scooter for the patient.  She will assist once Dr. Samuella Cota signs the order.

## 2018-09-02 NOTE — Telephone Encounter (Signed)
"  I am calling to schedule my surgery.  I crushed my foot on Sunday.  I saw Dr. Samuella Cota on Monday.  He said he would do surgery on my foot on Wednesday of next week."  Let me call you back.  I need to obtain your paperwork.  I am returning your call.  I'm trying to get you scheduled for Wednesday of next week for your surgery.  You will get a call from someone from the surgical center a day or two prior to your surgery date.  "I assume they will give me my appointment time then."  Yes, they will give you your arrival time.  "Do I need to have an appointment with my primary care physician before this surgery?  I was reading it on this paper you gave me."  You do not need a history and physical done prior to your surgery date.  You do need to go online and register with the surgical center.  "Okay, I'll take care of that."

## 2018-09-03 ENCOUNTER — Telehealth: Payer: Self-pay | Admitting: *Deleted

## 2018-09-03 NOTE — Telephone Encounter (Signed)
DOS 09/08/2018, CPT CODE 01720 - ORIF CALCANEUS RIGHT FOOT  [employer]: Effective Date  04/28/2018 -  Termination Date 04/27/9998  Individual In-Network (Calendar Year) Deductible $26.07 MET YTD  $9,106.81 remaining  $3,000.00 Plan Amt.   Out-of-Pocket $156.86 MET YTD  $6,619.69 remaining  $5,000.00 Plan Amt.  Inpatient and Outpatient Facility Visits CO-INSURANCE: 20%   ------------------------------------------------------------------------------------------------------------------------------------------------------------- This [employer] plan does not currently require a prior authorization for these services. If you have general questions about the prior authorization requirements, please call us at [phone] or visit [email or web address] > Clinician Resources > Advance and Admission Notification Requirements. The number above acknowledges your notification. Please write this number down for future reference. Notification is not a guarantee of coverage or payment.  Decision ID #:[id number]  ------------------------------------------------------------------------------------------------------------------------------------------------------------

## 2018-09-08 ENCOUNTER — Other Ambulatory Visit: Payer: Self-pay | Admitting: Podiatry

## 2018-09-08 DIAGNOSIS — S92061A Displaced intraarticular fracture of right calcaneus, initial encounter for closed fracture: Secondary | ICD-10-CM

## 2018-09-08 MED ORDER — ONDANSETRON HCL 4 MG PO TABS: 4.0000 mg | ORAL_TABLET | Freq: Three times a day (TID) | ORAL | 0 refills | Status: DC | PRN

## 2018-09-08 MED ORDER — OXYCODONE-ACETAMINOPHEN 10-325 MG PO TABS: 1.0000 | ORAL_TABLET | ORAL | 0 refills | Status: DC | PRN

## 2018-09-08 MED ORDER — CEPHALEXIN 500 MG PO CAPS: 500.0000 mg | ORAL_CAPSULE | Freq: Two times a day (BID) | ORAL | 0 refills | Status: DC

## 2018-09-09 ENCOUNTER — Telehealth: Payer: Self-pay

## 2018-09-09 ENCOUNTER — Encounter: Payer: Self-pay | Admitting: Podiatry

## 2018-09-09 NOTE — Telephone Encounter (Signed)
POST OP CALL-    1) General condition stated by the patient: Doing very well. Pt states some electrical zapping sensations in toes when trying to flex them on right foot.  2) Is the pt having pain? No  3) Pain score: Still numb from block  4) Has the pt taken Rx'd pain medication, regularly or PRN?   5) Is the pain medication giving relief?  6) Any fever, chills, nausea, or vomiting, shortness of breath or tightness in calf? No  7) Is the bandage clean, dry and intact? Yes.  8) Is there excessive tightness, bleeding or drainage coming through the bandage? No.  9) Did you understand all of the post op instruction sheet given?  10) Any questions or concerns regarding post op care/recovery? No.    Confirmed POV appointment with patient

## 2018-09-13 ENCOUNTER — Other Ambulatory Visit: Payer: Self-pay

## 2018-09-13 DIAGNOSIS — S92001A Unspecified fracture of right calcaneus, initial encounter for closed fracture: Secondary | ICD-10-CM | POA: Insufficient documentation

## 2018-09-13 DIAGNOSIS — W19XXXA Unspecified fall, initial encounter: Secondary | ICD-10-CM | POA: Insufficient documentation

## 2018-09-14 ENCOUNTER — Encounter: Payer: Self-pay | Admitting: Podiatry

## 2018-09-14 ENCOUNTER — Ambulatory Visit (INDEPENDENT_AMBULATORY_CARE_PROVIDER_SITE_OTHER): Payer: 59 | Admitting: Podiatry

## 2018-09-14 ENCOUNTER — Ambulatory Visit (INDEPENDENT_AMBULATORY_CARE_PROVIDER_SITE_OTHER): Payer: 59

## 2018-09-14 ENCOUNTER — Other Ambulatory Visit: Payer: Self-pay

## 2018-09-14 VITALS — BP 161/94 | HR 100 | Temp 97.9°F | Resp 16

## 2018-09-14 DIAGNOSIS — Z9889 Other specified postprocedural states: Secondary | ICD-10-CM | POA: Diagnosis not present

## 2018-09-14 DIAGNOSIS — S92061A Displaced intraarticular fracture of right calcaneus, initial encounter for closed fracture: Secondary | ICD-10-CM

## 2018-09-14 MED ORDER — GABAPENTIN 300 MG PO CAPS: 300.0000 mg | ORAL_CAPSULE | Freq: Three times a day (TID) | ORAL | 0 refills | Status: DC

## 2018-09-14 MED ORDER — OXYCODONE HCL 5 MG PO TABS: 5.0000 mg | ORAL_TABLET | ORAL | 0 refills | Status: AC | PRN

## 2018-09-14 NOTE — Progress Notes (Signed)
Subjective:  Patient ID: Manuel Stevenson, male    DOB: 02/07/1971,  MRN: [id number]  Chief Complaint  Patient presents with  . Routine Post Op    POV#1 DOS 09/08/2018 ORIF CALCANEUS RT Pt. states," 3 nights ago started hurting pretty vad; 10/10 shapr pain." -pt c/o burning and stabbing pain in the midfoot -pt denie sN/V/F/Ch Tx:" crutches, abe, pain meds and elevation     DOS: 09/08/2018 Procedure: R Calcaneus ORIF  48 y.o. male returns for post-op check. Had lots of pain when the nerve block wore off. States the pain feels like a hot knife stabbing him in the foot.  Review of Systems: Negative except as noted in the HPI. Denies N/V/F/Ch.  No past medical history on file.  Current Outpatient Medications:  .  amoxicillin (AMOXIL) 500 MG tablet, amoxicillin 500 mg tablet  Take 2 tablets every 8 hours by oral route as directed for 10 days., Disp: , Rfl:  .  cephALEXin (KEFLEX) 500 MG capsule, Take 1 capsule (500 mg total) by mouth 2 (two) times daily., Disp: 14 capsule, Rfl: 0 .  lisinopril (ZESTRIL) 10 MG tablet, lisinopril 10 mg tablet  Take 1 tablet every day by oral route., Disp: , Rfl:  .  omeprazole (PRILOSEC) 10 MG capsule, omeprazole 10 mg capsule,delayed release  Take 2 capsules every day by oral route., Disp: , Rfl:  .  ondansetron (ZOFRAN) 4 MG tablet, Take 1 tablet (4 mg total) by mouth every 8 (eight) hours as needed for nausea or vomiting., Disp: 20 tablet, Rfl: 0 .  oxyCODONE (ROXICODONE) 5 MG immediate release tablet, Take 1 tablet (5 mg total) by mouth every 4 (four) hours as needed for severe pain., Disp: 20 tablet, Rfl: 0 .  oxyCODONE-acetaminophen (PERCOCET) 10-325 MG tablet, Take 1 tablet by mouth every 4 (four) hours as needed for pain., Disp: 20 tablet, Rfl: 0 .  tadalafil (CIALIS) 5 MG tablet, Take 5 mg by mouth daily., Disp: , Rfl:  .  testosterone cypionate (DEPOTESTOSTERONE CYPIONATE) 200 MG/ML injection, INJECT 0.4 ML INTRAMUSCULARLY ONCE A WEEK, Disp: , Rfl:  .   traMADol (ULTRAM) 50 MG tablet, , Disp: , Rfl:  .  gabapentin (NEURONTIN) 300 MG capsule, Take 1 capsule (300 mg total) by mouth 3 (three) times daily., Disp: 30 capsule, Rfl: 0  Social History   Tobacco Use  Smoking Status Never Smoker  Smokeless Tobacco Never Used    Allergies  Allergen Reactions  . Codeine Other (See Comments) and Nausea And Vomiting   Objective:   Vitals:   09/14/18 1019  BP: (!) 161/94  Pulse: 100  Resp: 16  Temp: 97.9 F (36.6 C)   There is no height or weight on file to calculate BMI. Constitutional Well developed. Well nourished.  Vascular Foot warm and well perfused. Capillary refill normal to all digits.   Neurologic Normal speech. Oriented to person, place, and time. Epicritic sensation to light touch grossly present bilaterally. Diminished over sural nerve distribution but intact at the toes.   Dermatologic Skin healing well without signs of infection. Skin edges well coapted without signs of infection.  Orthopedic: Tenderness to palpation noted about the surgical site.   Radiographs: Taken and reviewed. Acceptable fracture alignment. Hardware appears intact. No evidence of hardware failure. Assessment:   1. Closed displaced intra-articular fracture of right calcaneus, initial encounter    Plan:  Patient was evaluated and treated and all questions answered.  S/p foot surgery right -XR: Reviewed as above. -WB Status:  NWB in right foot -Sutures: Staples intact. -Medications: Refill oxycodone.  -Foot redressed.  Sural Neuritis -Rx gabapentin for neuritis -Discussed neuritis 2/2 variant location of nerve during surgery. Sensation is intact distally so likely neuropraxia that should hopefully resolve with time.  Return in about 1 week (around 09/21/2018) for fracture follow up possible cast .

## 2018-09-15 ENCOUNTER — Other Ambulatory Visit: Payer: Self-pay | Admitting: Podiatry

## 2018-09-15 DIAGNOSIS — Z9889 Other specified postprocedural states: Secondary | ICD-10-CM

## 2018-09-15 DIAGNOSIS — S92061A Displaced intraarticular fracture of right calcaneus, initial encounter for closed fracture: Secondary | ICD-10-CM

## 2018-09-21 ENCOUNTER — Ambulatory Visit (INDEPENDENT_AMBULATORY_CARE_PROVIDER_SITE_OTHER): Payer: 59 | Admitting: Podiatry

## 2018-09-21 ENCOUNTER — Encounter: Payer: Self-pay | Admitting: Podiatry

## 2018-09-21 ENCOUNTER — Other Ambulatory Visit: Payer: Self-pay

## 2018-09-21 VITALS — BP 185/106 | HR 63 | Temp 98.2°F | Resp 16

## 2018-09-21 DIAGNOSIS — S92061A Displaced intraarticular fracture of right calcaneus, initial encounter for closed fracture: Secondary | ICD-10-CM | POA: Diagnosis not present

## 2018-09-21 NOTE — Progress Notes (Signed)
Subjective:  Patient ID: Manuel Stevenson, male    DOB: 1970-11-02,  MRN: [id number]  Chief Complaint  Patient presents with  . Routine Post Op    POV#1 DOS 09/08/2018 ORIF CALCANEUS RT Pt. states," still very painful, it hurts all the time, nothing stops the pain; 6/10 shapr contatn pain." tx: cam boot, oxy (PRN), and gaba -pt denie sN/V?F?Ch     DOS: 09/08/2018 Procedure: R Calcaneus ORIF  48 y.o. male returns for post-op check. History as above. Denies post-op issues.  Review of Systems: Negative except as noted in the HPI. Denies N/V/F/Ch.  No past medical history on file.  Current Outpatient Medications:  .  amoxicillin (AMOXIL) 500 MG tablet, amoxicillin 500 mg tablet  Take 2 tablets every 8 hours by oral route as directed for 10 days., Disp: , Rfl:  .  cephALEXin (KEFLEX) 500 MG capsule, Take 1 capsule (500 mg total) by mouth 2 (two) times daily., Disp: 14 capsule, Rfl: 0 .  gabapentin (NEURONTIN) 300 MG capsule, Take 1 capsule (300 mg total) by mouth 3 (three) times daily., Disp: 30 capsule, Rfl: 0 .  lisinopril (ZESTRIL) 10 MG tablet, lisinopril 10 mg tablet  Take 1 tablet every day by oral route., Disp: , Rfl:  .  omeprazole (PRILOSEC) 10 MG capsule, omeprazole 10 mg capsule,delayed release  Take 2 capsules every day by oral route., Disp: , Rfl:  .  ondansetron (ZOFRAN) 4 MG tablet, Take 1 tablet (4 mg total) by mouth every 8 (eight) hours as needed for nausea or vomiting., Disp: 20 tablet, Rfl: 0 .  oxyCODONE (ROXICODONE) 5 MG immediate release tablet, Take 1 tablet (5 mg total) by mouth every 4 (four) hours as needed for severe pain., Disp: 20 tablet, Rfl: 0 .  oxyCODONE-acetaminophen (PERCOCET) 10-325 MG tablet, Take 1 tablet by mouth every 4 (four) hours as needed for pain., Disp: 20 tablet, Rfl: 0 .  tadalafil (CIALIS) 5 MG tablet, Take 5 mg by mouth daily., Disp: , Rfl:  .  testosterone cypionate (DEPOTESTOSTERONE CYPIONATE) 200 MG/ML injection, INJECT 0.4 ML INTRAMUSCULARLY  ONCE A WEEK, Disp: , Rfl:  .  traMADol (ULTRAM) 50 MG tablet, , Disp: , Rfl:   Social History   Tobacco Use  Smoking Status Never Smoker  Smokeless Tobacco Never Used    Allergies  Allergen Reactions  . Codeine Other (See Comments) and Nausea And Vomiting   Objective:   Vitals:   09/21/18 0824  BP: (!) 185/106  Pulse: 63  Resp: 16  Temp: 98.2 F (36.8 C)   There is no height or weight on file to calculate BMI. Constitutional Well developed. Well nourished.  Vascular Foot warm and well perfused. Capillary refill normal to all digits.   Neurologic Normal speech. Oriented to person, place, and time. Epicritic sensation to light touch grossly present bilaterally. Diminished over sural nerve distribution but increased compared to last exam.  Dermatologic Skin healing well without signs of infection. Skin edges well coapted without signs of infection.  Orthopedic: Tenderness to palpation noted about the surgical site.   Radiographs: None today. Assessment:   1. Closed displaced intra-articular fracture of right calcaneus, initial encounter    Plan:  Patient was evaluated and treated and all questions answered.  S/p foot surgery right -XR: Reviewed as above. -WB Status: NWB in right foot -Sutures: Staples intact. -Short leg cast applied today with 3 fiberglass rolls. Neurovascularly intact s/p application. -Medications: No refill today. Offered increasing pain medication to dilaudid. Patient declined. -  Foot redressed.  Sural Neuritis -Continue gabapentin. -Neuritis improving. Sensation returning.  No follow-ups on file.

## 2018-10-05 ENCOUNTER — Ambulatory Visit (INDEPENDENT_AMBULATORY_CARE_PROVIDER_SITE_OTHER): Payer: 59

## 2018-10-05 ENCOUNTER — Ambulatory Visit (INDEPENDENT_AMBULATORY_CARE_PROVIDER_SITE_OTHER): Payer: 59 | Admitting: Podiatry

## 2018-10-05 ENCOUNTER — Other Ambulatory Visit: Payer: Self-pay

## 2018-10-05 DIAGNOSIS — W11XXXD Fall on and from ladder, subsequent encounter: Secondary | ICD-10-CM | POA: Diagnosis not present

## 2018-10-05 DIAGNOSIS — S92061D Displaced intraarticular fracture of right calcaneus, subsequent encounter for fracture with routine healing: Secondary | ICD-10-CM | POA: Diagnosis not present

## 2018-10-05 DIAGNOSIS — M79671 Pain in right foot: Secondary | ICD-10-CM

## 2018-10-05 MED ORDER — GABAPENTIN 300 MG PO CAPS: 300.0000 mg | ORAL_CAPSULE | Freq: Four times a day (QID) | ORAL | 0 refills | Status: DC

## 2018-10-05 MED ORDER — HYDROCODONE-ACETAMINOPHEN 5-325 MG PO TABS: 1.0000 | ORAL_TABLET | ORAL | 0 refills | Status: DC | PRN

## 2018-10-05 NOTE — Progress Notes (Signed)
Subjective:  Patient ID: Manuel Stevenson, male    DOB: [birth year],  MRN: [id number]  No chief complaint on file.   DOS: 09/08/2018 Procedure: R Calcaneus ORIF  48 y.o. male returns for post-op check. History as above. Having significant burning in hte foot making it difficult for him to sleep.  Has not been taking gabapentin 3 times a day as directed.  Review of Systems: Negative except as noted in the HPI. Denies N/V/F/Ch.  No past medical history on file.  Current Outpatient Medications:  .  amoxicillin (AMOXIL) 500 MG tablet, amoxicillin 500 mg tablet  Take 2 tablets every 8 hours by oral route as directed for 10 days., Disp: , Rfl:  .  cephALEXin (KEFLEX) 500 MG capsule, Take 1 capsule (500 mg total) by mouth 2 (two) times daily., Disp: 14 capsule, Rfl: 0 .  gabapentin (NEURONTIN) 300 MG capsule, Take 1 capsule (300 mg total) by mouth 4 (four) times daily., Disp: 60 capsule, Rfl: 0 .  HYDROcodone-acetaminophen (NORCO) 5-325 MG tablet, Take 1 tablet by mouth every 4 (four) hours as needed for moderate pain., Disp: 12 tablet, Rfl: 0 .  lisinopril (ZESTRIL) 10 MG tablet, lisinopril 10 mg tablet  Take 1 tablet every day by oral route., Disp: , Rfl:  .  omeprazole (PRILOSEC) 10 MG capsule, omeprazole 10 mg capsule,delayed release  Take 2 capsules every day by oral route., Disp: , Rfl:  .  ondansetron (ZOFRAN) 4 MG tablet, Take 1 tablet (4 mg total) by mouth every 8 (eight) hours as needed for nausea or vomiting., Disp: 20 tablet, Rfl: 0 .  oxyCODONE (ROXICODONE) 5 MG immediate release tablet, Take 1 tablet (5 mg total) by mouth every 4 (four) hours as needed for severe pain., Disp: 20 tablet, Rfl: 0 .  oxyCODONE-acetaminophen (PERCOCET) 10-325 MG tablet, Take 1 tablet by mouth every 4 (four) hours as needed for pain., Disp: 20 tablet, Rfl: 0 .  tadalafil (CIALIS) 5 MG tablet, Take 5 mg by mouth daily., Disp: , Rfl:  .  testosterone cypionate (DEPOTESTOSTERONE CYPIONATE) 200 MG/ML injection,  INJECT 0.4 ML INTRAMUSCULARLY ONCE A WEEK, Disp: , Rfl:  .  traMADol (ULTRAM) 50 MG tablet, , Disp: , Rfl:   Social History   Tobacco Use  Smoking Status Never Smoker  Smokeless Tobacco Never Used    Allergies  Allergen Reactions  . Codeine Other (See Comments) and Nausea And Vomiting   Objective:   There were no vitals filed for this visit. There is no height or weight on file to calculate BMI. Constitutional Well developed. Well nourished.  Vascular Foot warm and well perfused. Capillary refill normal to all digits.   Neurologic Normal speech. Oriented to person, place, and time. Epicritic sensation to light touch grossly present bilaterally. Diminished over sural nerve distribution but increased compared to last exam.  Dermatologic Skin healing well without signs of infection. Skin edges well coapted without signs of infection.  Orthopedic: Tenderness to palpation noted about the surgical site.   Radiographs: None today. Assessment:   1. Closed displaced intra-articular fracture of right calcaneus, initial encounter   2. Fall from ladder, initial encounter   3. Pain of right heel    Plan:  Patient was evaluated and treated and all questions answered.  S/p foot surgery right -XR: Reviewed as above. -WB Status: NWB in right foot -Sutures: Staples intact. -Short leg cast removed. Not reapplied today. -Medications:  Rx Norco for pain. -Foot redressed.  Sural Neuritis -Continue gabapentin. Refilled. Increased  dosage today to TID. Advised to take 600 mg at night. -Appears to be improving.  No follow-ups on file.

## 2018-10-12 ENCOUNTER — Encounter: Payer: 59 | Admitting: Podiatry

## 2018-10-12 ENCOUNTER — Other Ambulatory Visit: Payer: Self-pay

## 2018-10-18 ENCOUNTER — Other Ambulatory Visit: Payer: Self-pay

## 2018-10-18 ENCOUNTER — Ambulatory Visit (INDEPENDENT_AMBULATORY_CARE_PROVIDER_SITE_OTHER): Payer: 59 | Admitting: Podiatry

## 2018-10-18 ENCOUNTER — Ambulatory Visit (INDEPENDENT_AMBULATORY_CARE_PROVIDER_SITE_OTHER): Payer: 59

## 2018-10-18 ENCOUNTER — Encounter: Payer: Self-pay | Admitting: Podiatry

## 2018-10-18 VITALS — Temp 99.2°F | Resp 16

## 2018-10-18 DIAGNOSIS — M79671 Pain in right foot: Secondary | ICD-10-CM

## 2018-10-18 DIAGNOSIS — S92061D Displaced intraarticular fracture of right calcaneus, subsequent encounter for fracture with routine healing: Secondary | ICD-10-CM

## 2018-10-18 DIAGNOSIS — W11XXXD Fall on and from ladder, subsequent encounter: Secondary | ICD-10-CM | POA: Diagnosis not present

## 2018-10-18 MED ORDER — TRAMADOL HCL 50 MG PO TABS: 50.0000 mg | ORAL_TABLET | Freq: Four times a day (QID) | ORAL | 0 refills | Status: DC | PRN

## 2018-10-18 NOTE — Progress Notes (Signed)
Subjective:  Patient ID: Manuel Stevenson, male    DOB: 06/24/1970,  MRN: [id number]  Chief Complaint  Patient presents with  . Routine Post Op    DOS 09/08/2018 ORIF CALCANEUS RT -pt states," pain is the same, it hurts every time; 8/10 sharp constatn pain." Tx: boot, crutches, narco, and gaba -pt denies N/V/F?Ch -w/ drainage adn redness    DOS: 09/08/2018 Procedure: R Calcaneus ORIF  48 y.o. male returns for post-op check. History as above. Thinks the nerve is getting a little better but still hurting both up his leg and down his foot. Tramadol helping with his pain.  Review of Systems: Negative except as noted in the HPI. Denies N/V/F/Ch.  No past medical history on file.  Current Outpatient Medications:  .  amoxicillin (AMOXIL) 500 MG tablet, amoxicillin 500 mg tablet  Take 2 tablets every 8 hours by oral route as directed for 10 days., Disp: , Rfl:  .  carvedilol (COREG) 6.25 MG tablet, Take 6.25 mg by mouth 2 (two) times daily., Disp: , Rfl:  .  cephALEXin (KEFLEX) 500 MG capsule, Take 1 capsule (500 mg total) by mouth 2 (two) times daily., Disp: 14 capsule, Rfl: 0 .  gabapentin (NEURONTIN) 300 MG capsule, Take 1 capsule (300 mg total) by mouth 4 (four) times daily., Disp: 60 capsule, Rfl: 0 .  HYDROcodone-acetaminophen (NORCO) 5-325 MG tablet, Take 1 tablet by mouth every 4 (four) hours as needed for moderate pain., Disp: 12 tablet, Rfl: 0 .  lisinopril (ZESTRIL) 10 MG tablet, lisinopril 10 mg tablet  Take 1 tablet every day by oral route., Disp: , Rfl:  .  omeprazole (PRILOSEC) 10 MG capsule, omeprazole 10 mg capsule,delayed release  Take 2 capsules every day by oral route., Disp: , Rfl:  .  ondansetron (ZOFRAN) 4 MG tablet, Take 1 tablet (4 mg total) by mouth every 8 (eight) hours as needed for nausea or vomiting., Disp: 20 tablet, Rfl: 0 .  oxyCODONE (ROXICODONE) 5 MG immediate release tablet, Take 1 tablet (5 mg total) by mouth every 4 (four) hours as needed for severe pain., Disp:  20 tablet, Rfl: 0 .  oxyCODONE-acetaminophen (PERCOCET) 10-325 MG tablet, Take 1 tablet by mouth every 4 (four) hours as needed for pain., Disp: 20 tablet, Rfl: 0 .  tadalafil (CIALIS) 5 MG tablet, Take 5 mg by mouth daily., Disp: , Rfl:  .  testosterone cypionate (DEPOTESTOSTERONE CYPIONATE) 200 MG/ML injection, INJECT 0.4 ML INTRAMUSCULARLY ONCE A WEEK, Disp: , Rfl:  .  traMADol (ULTRAM) 50 MG tablet, Take 1 tablet (50 mg total) by mouth every 6 (six) hours as needed., Disp: 30 tablet, Rfl: 0  Social History   Tobacco Use  Smoking Status Never Smoker  Smokeless Tobacco Never Used    Allergies  Allergen Reactions  . Codeine Other (See Comments) and Nausea And Vomiting   Objective:   Vitals:   10/18/18 1038  Resp: 16  Temp: 99.2 F (37.3 C)   There is no height or weight on file to calculate BMI. Constitutional Well developed. Well nourished.  Vascular Foot warm and well perfused. Capillary refill normal to all digits.   Neurologic Normal speech. Oriented to person, place, and time. Epicritic sensation to light touch grossly present bilaterally. Diminished over sural nerve distribution but increased compared to last exam.  Dermatologic Skin with superficial dehiscence with fibrotic base, no deep dehiscence, no probe to bone, no warmth, no erythema, no signs of acute infection.  Orthopedic: Tenderness to palpation noted  about the surgical site.   Radiographs: Taken and reviewed hardware intact good alignment noted. Assessment:   1. Closed displaced intra-articular fracture of right calcaneus with routine healing, subsequent encounter   2. Fall from ladder, subsequent encounter   3. Pain of right heel    Plan:  Patient was evaluated and treated and all questions answered.  S/p foot surgery right -XR: Reviewed as above. -WB Status: NWB in right foot -Sutures: Staples removed -Dispense iodosorb to be used daily.  -Medications:  Rx Norco for pain. -Foot redressed.   Sural Neuritis -Continue gabapentin -Will take time but appears to be improving.  Return in about 1 week (around 10/25/2018) for Post-op, Right no XRs .

## 2018-10-25 ENCOUNTER — Other Ambulatory Visit: Payer: Self-pay | Admitting: Podiatry

## 2018-10-25 ENCOUNTER — Other Ambulatory Visit: Payer: Self-pay

## 2018-10-25 ENCOUNTER — Ambulatory Visit (INDEPENDENT_AMBULATORY_CARE_PROVIDER_SITE_OTHER): Payer: 59 | Admitting: Podiatry

## 2018-10-25 DIAGNOSIS — Z9889 Other specified postprocedural states: Secondary | ICD-10-CM

## 2018-10-25 DIAGNOSIS — S92061D Displaced intraarticular fracture of right calcaneus, subsequent encounter for fracture with routine healing: Secondary | ICD-10-CM

## 2018-10-25 NOTE — Progress Notes (Signed)
Subjective:  Patient ID: Manuel Stevenson, male    DOB: 05/16/1970,  MRN: [id number]  Chief Complaint  Patient presents with  . Routine Post Op    POV Right Pt. states," I'm stil painful, pain goes up Manuel side of Manuel leg to my hip; 3/10 pain." Tx: tramadil, gaba, boot and crutches     DOS: 09/08/2018 Procedure: R Calcaneus ORIF  48 y.o. male returns for post-op check. Hx as above.  Review of Systems: Negative except as noted in Manuel HPI. Denies N/V/F/Ch.  No past medical history on file.  Current Outpatient Medications:  .  amoxicillin (AMOXIL) 500 MG tablet, amoxicillin 500 mg tablet  Take 2 tablets every 8 hours by oral route as directed for 10 days., Disp: , Rfl:  .  carvedilol (COREG) 6.25 MG tablet, Take 6.25 mg by mouth 2 (two) times daily., Disp: , Rfl:  .  cephALEXin (KEFLEX) 500 MG capsule, Take 1 capsule (500 mg total) by mouth 2 (two) times daily., Disp: 14 capsule, Rfl: 0 .  gabapentin (NEURONTIN) 300 MG capsule, Take 1 capsule (300 mg total) by mouth 4 (four) times daily., Disp: 60 capsule, Rfl: 0 .  HYDROcodone-acetaminophen (NORCO) 5-325 MG tablet, Take 1 tablet by mouth every 4 (four) hours as needed for moderate pain., Disp: 12 tablet, Rfl: 0 .  lisinopril (ZESTRIL) 10 MG tablet, lisinopril 10 mg tablet  Take 1 tablet every day by oral route., Disp: , Rfl:  .  omeprazole (PRILOSEC) 10 MG capsule, omeprazole 10 mg capsule,delayed release  Take 2 capsules every day by oral route., Disp: , Rfl:  .  ondansetron (ZOFRAN) 4 MG tablet, Take 1 tablet (4 mg total) by mouth every 8 (eight) hours as needed for nausea or vomiting., Disp: 20 tablet, Rfl: 0 .  oxyCODONE (ROXICODONE) 5 MG immediate release tablet, Take 1 tablet (5 mg total) by mouth every 4 (four) hours as needed for severe pain., Disp: 20 tablet, Rfl: 0 .  oxyCODONE-acetaminophen (PERCOCET) 10-325 MG tablet, Take 1 tablet by mouth every 4 (four) hours as needed for pain., Disp: 20 tablet, Rfl: 0 .  tadalafil (CIALIS) 5  MG tablet, Take 5 mg by mouth daily., Disp: , Rfl:  .  testosterone cypionate (DEPOTESTOSTERONE CYPIONATE) 200 MG/ML injection, INJECT 0.4 ML INTRAMUSCULARLY ONCE A WEEK, Disp: , Rfl:  .  traMADol (ULTRAM) 50 MG tablet, Take 1 tablet (50 mg total) by mouth every 6 (six) hours as needed., Disp: 30 tablet, Rfl: 0  Social History   Tobacco Use  Smoking Status Never Smoker  Smokeless Tobacco Never Used    Allergies  Allergen Reactions  . Codeine Other (See Comments) and Nausea And Vomiting   Objective:   There were no vitals filed for this visit. There is no height or weight on file to calculate BMI. Constitutional Well developed. Well nourished.  Vascular Foot warm and well perfused. Capillary refill normal to all digits.   Neurologic Normal speech. Oriented to person, place, and time. Epicritic sensation to light touch grossly present bilaterally. Diminished over sural nerve distribution  Dermatologic Skin with superficial dehiscence with dry wound base with slight fibrosis. No active drainage no signs of infection.  Orthopedic: Tenderness to palpation noted about Manuel surgical site.   Radiographs: None today. Assessment:   1. Closed displaced intra-articular fracture of right calcaneus with routine healing, subsequent encounter    Plan:  Patient was evaluated and treated and all questions answered.  S/p foot surgery right -XR: Reviewed as above. -  WB Status: NWB in right foot -Start PT. Work on PROM, [employer]; AROM after 1 week. -Sutures: Staples removed -Continue iodosorb.  Sural Neuritis -Continue gabapentin, up to four times daily.   Return in about 1 week (around 11/01/2018) for Post-op.

## 2018-11-01 ENCOUNTER — Other Ambulatory Visit: Payer: Self-pay | Admitting: Podiatry

## 2018-11-01 ENCOUNTER — Other Ambulatory Visit: Payer: Self-pay

## 2018-11-01 ENCOUNTER — Ambulatory Visit (INDEPENDENT_AMBULATORY_CARE_PROVIDER_SITE_OTHER): Payer: 59

## 2018-11-01 ENCOUNTER — Ambulatory Visit (INDEPENDENT_AMBULATORY_CARE_PROVIDER_SITE_OTHER): Payer: 59 | Admitting: Podiatry

## 2018-11-01 DIAGNOSIS — S92061D Displaced intraarticular fracture of right calcaneus, subsequent encounter for fracture with routine healing: Secondary | ICD-10-CM

## 2018-11-01 DIAGNOSIS — Z9889 Other specified postprocedural states: Secondary | ICD-10-CM

## 2018-11-02 ENCOUNTER — Other Ambulatory Visit: Payer: Self-pay | Admitting: Podiatry

## 2018-11-02 DIAGNOSIS — Z9889 Other specified postprocedural states: Secondary | ICD-10-CM

## 2018-11-02 DIAGNOSIS — S92061D Displaced intraarticular fracture of right calcaneus, subsequent encounter for fracture with routine healing: Secondary | ICD-10-CM

## 2018-11-15 ENCOUNTER — Encounter: Payer: 59 | Admitting: Podiatry

## 2018-11-15 ENCOUNTER — Telehealth: Payer: Self-pay | Admitting: *Deleted

## 2018-11-15 MED ORDER — GABAPENTIN 300 MG PO CAPS: 300.0000 mg | ORAL_CAPSULE | Freq: Four times a day (QID) | ORAL | 0 refills | Status: AC

## 2018-11-15 MED ORDER — TRAMADOL HCL 50 MG PO TABS: 50.0000 mg | ORAL_TABLET | Freq: Four times a day (QID) | ORAL | 0 refills | Status: AC | PRN

## 2018-11-15 NOTE — Telephone Encounter (Signed)
Called patient to reschedule appointment.  Patient states he is needing refills on his gabapentin and tramadol he will run out before rescheduled appointment. Please advise.

## 2018-11-16 NOTE — Telephone Encounter (Signed)
Patient notified

## 2018-11-21 NOTE — Progress Notes (Signed)
Subjective:  Patient ID: Manuel Stevenson, male    DOB: 05/25/1970,  MRN: [id number]  Chief Complaint  Patient presents with  . Routine Post Op    POV -Pt. states Friday night he missed a step, loss balanced and landed on his sx foot, ever since his foot has been very painful and swollen." Tx: tramadol, and cam boot -pt denies N/V/F?Ch     DOS: 09/08/2018 Procedure: R Calcaneus ORIF  48 y.o. male returns for post-op check. Hx as above.  Review of Systems: Negative except as noted in Manuel HPI. Denies N/V/F/Ch.  No past medical history on file.  Current Outpatient Medications:  .  amoxicillin (AMOXIL) 500 MG tablet, amoxicillin 500 mg tablet  Take 2 tablets every 8 hours by oral route as directed for 10 days., Disp: , Rfl:  .  carvedilol (COREG) 6.25 MG tablet, Take 6.25 mg by mouth 2 (two) times daily., Disp: , Rfl:  .  cephALEXin (KEFLEX) 500 MG capsule, Take 1 capsule (500 mg total) by mouth 2 (two) times daily., Disp: 14 capsule, Rfl: 0 .  HYDROcodone-acetaminophen (NORCO) 5-325 MG tablet, Take 1 tablet by mouth every 4 (four) hours as needed for moderate pain., Disp: 12 tablet, Rfl: 0 .  lisinopril (ZESTRIL) 10 MG tablet, lisinopril 10 mg tablet  Take 1 tablet every day by oral route., Disp: , Rfl:  .  omeprazole (PRILOSEC) 10 MG capsule, omeprazole 10 mg capsule,delayed release  Take 2 capsules every day by oral route., Disp: , Rfl:  .  ondansetron (ZOFRAN) 4 MG tablet, Take 1 tablet (4 mg total) by mouth every 8 (eight) hours as needed for nausea or vomiting., Disp: 20 tablet, Rfl: 0 .  oxyCODONE (ROXICODONE) 5 MG immediate release tablet, Take 1 tablet (5 mg total) by mouth every 4 (four) hours as needed for severe pain., Disp: 20 tablet, Rfl: 0 .  oxyCODONE-acetaminophen (PERCOCET) 10-325 MG tablet, Take 1 tablet by mouth every 4 (four) hours as needed for pain., Disp: 20 tablet, Rfl: 0 .  tadalafil (CIALIS) 5 MG tablet, Take 5 mg by mouth daily., Disp: , Rfl:  .  testosterone  cypionate (DEPOTESTOSTERONE CYPIONATE) 200 MG/ML injection, INJECT 0.4 ML INTRAMUSCULARLY ONCE A WEEK, Disp: , Rfl:  .  gabapentin (NEURONTIN) 300 MG capsule, Take 1 capsule (300 mg total) by mouth 4 (four) times daily., Disp: 60 capsule, Rfl: 0 .  traMADol (ULTRAM) 50 MG tablet, Take 1 tablet (50 mg total) by mouth every 6 (six) hours as needed., Disp: 30 tablet, Rfl: 0  Social History   Tobacco Use  Smoking Status Never Smoker  Smokeless Tobacco Never Used    Allergies  Allergen Reactions  . Codeine Other (See Comments) and Nausea And Vomiting   Objective:   There were no vitals filed for this visit. There is no height or weight on file to calculate BMI. Constitutional Well developed. Well nourished.  Vascular Foot warm and well perfused. Capillary refill normal to all digits.   Neurologic Normal speech. Oriented to person, place, and time. Epicritic sensation to light touch grossly present bilaterally. Diminished over sural nerve distribution  Dermatologic  dehiscence improving with only small open area remaining no deep dehiscence no evidence of hardware exposure  Orthopedic: Tenderness to palpation noted about Manuel surgical site.   Radiographs: Taken and reviewed fracture appears to be healing good alignment no evidence of interval change in alignment Assessment:   1. Post-operative state    Plan:  Patient was evaluated and treated  and all questions answered.  S/p foot surgery right -XR: Reviewed as above. -WB Status: NWB in right foot -Continue PT -Continue iodosorb.  Sural Neuritis -Continue gabapentin, up to four times daily.   No follow-ups on file.

## 2018-11-22 ENCOUNTER — Encounter: Payer: Self-pay | Admitting: Podiatry

## 2018-11-22 ENCOUNTER — Ambulatory Visit (INDEPENDENT_AMBULATORY_CARE_PROVIDER_SITE_OTHER): Payer: 59

## 2018-11-22 ENCOUNTER — Ambulatory Visit (INDEPENDENT_AMBULATORY_CARE_PROVIDER_SITE_OTHER): Payer: Self-pay | Admitting: Podiatry

## 2018-11-22 ENCOUNTER — Other Ambulatory Visit: Payer: Self-pay

## 2018-11-22 VITALS — Temp 97.2°F | Resp 16

## 2018-11-22 DIAGNOSIS — S92061D Displaced intraarticular fracture of right calcaneus, subsequent encounter for fracture with routine healing: Secondary | ICD-10-CM

## 2018-11-22 DIAGNOSIS — Z9889 Other specified postprocedural states: Secondary | ICD-10-CM

## 2018-11-22 DIAGNOSIS — M79671 Pain in right foot: Secondary | ICD-10-CM | POA: Diagnosis not present

## 2018-11-22 DIAGNOSIS — W11XXXD Fall on and from ladder, subsequent encounter: Secondary | ICD-10-CM

## 2018-11-22 NOTE — Progress Notes (Signed)
Subjective:  Patient ID: Manuel Stevenson, male    DOB: Oct 04, 1970,  MRN: [id number]  Chief Complaint  Patient presents with   Routine Post Op    PT. states," it hurts (8-9/10 pain) and it still drains (clear-brown crusty color)." Tx: PT, boot, crutiches and iodosorb     DOS: 09/08/2018 Procedure: R Calcaneus ORIF  48 y.o. male returns for post-op check. Hx as above. Now about 10 weeks out. Still having some drainage from the wound area.   Review of Systems: Negative except as noted in the HPI. Denies N/V/F/Ch.  No past medical history on file.  Current Outpatient Medications:    amoxicillin (AMOXIL) 500 MG tablet, amoxicillin 500 mg tablet  Take 2 tablets every 8 hours by oral route as directed for 10 days., Disp: , Rfl:    carvedilol (COREG) 6.25 MG tablet, Take 6.25 mg by mouth 2 (two) times daily., Disp: , Rfl:    cephALEXin (KEFLEX) 500 MG capsule, Take 1 capsule (500 mg total) by mouth 2 (two) times daily., Disp: 14 capsule, Rfl: 0   gabapentin (NEURONTIN) 300 MG capsule, Take 1 capsule (300 mg total) by mouth 4 (four) times daily., Disp: 60 capsule, Rfl: 0   HYDROcodone-acetaminophen (NORCO) 5-325 MG tablet, Take 1 tablet by mouth every 4 (four) hours as needed for moderate pain., Disp: 12 tablet, Rfl: 0   lisinopril (ZESTRIL) 10 MG tablet, lisinopril 10 mg tablet  Take 1 tablet every day by oral route., Disp: , Rfl:    omeprazole (PRILOSEC) 10 MG capsule, omeprazole 10 mg capsule,delayed release  Take 2 capsules every day by oral route., Disp: , Rfl:    ondansetron (ZOFRAN) 4 MG tablet, Take 1 tablet (4 mg total) by mouth every 8 (eight) hours as needed for nausea or vomiting., Disp: 20 tablet, Rfl: 0   oxyCODONE (ROXICODONE) 5 MG immediate release tablet, Take 1 tablet (5 mg total) by mouth every 4 (four) hours as needed for severe pain., Disp: 20 tablet, Rfl: 0   oxyCODONE-acetaminophen (PERCOCET) 10-325 MG tablet, Take 1 tablet by mouth every 4 (four) hours as needed  for pain., Disp: 20 tablet, Rfl: 0   tadalafil (CIALIS) 5 MG tablet, Take 5 mg by mouth daily., Disp: , Rfl:    testosterone cypionate (DEPOTESTOSTERONE CYPIONATE) 200 MG/ML injection, INJECT 0.4 ML INTRAMUSCULARLY ONCE A WEEK, Disp: , Rfl:    traMADol (ULTRAM) 50 MG tablet, Take 1 tablet (50 mg total) by mouth every 6 (six) hours as needed., Disp: 30 tablet, Rfl: 0  Social History   Tobacco Use  Smoking Status Never Smoker  Smokeless Tobacco Never Used    Allergies  Allergen Reactions   Codeine Other (See Comments) and Nausea And Vomiting   Objective:   Vitals:   11/22/18 1109  Resp: 16  Temp: (!) 97.2 F (36.2 C)   There is no height or weight on file to calculate BMI. Constitutional Well developed. Well nourished.  Vascular Foot warm and well perfused. Capillary refill normal to all digits.   Neurologic Normal speech. Oriented to person, place, and time. Epicritic sensation to light touch grossly present bilaterally. Diminished over sural nerve distribution  Dermatologic Small superficial dehiscence without warmth erythema signs of infection.  Orthopedic: Tenderness to palpation noted about the surgical site.   Radiographs: Taken and reviewed fracture appears to be healed good subtalar alignment noted. Assessment:   1. Post-operative state   2. Closed displaced intra-articular fracture of right calcaneus with routine healing, subsequent encounter  3. Fall from ladder, subsequent encounter   4. Pain of right heel    Plan:  Patient was evaluated and treated and all questions answered.  S/p foot surgery right -XR: Reviewed as above. -WB Status: 2 crutch assist WB in CAM boot RLE -Continue PT. Transition WB with PT. -Zipline wound closure device applied to wound to bring skin edges together. Wound debrided. Covered under global.  Sural Neuritis -Continue gabapentin, up to four times daily.   No follow-ups on file. F/u in 2 weeks for new XRs.

## 2018-12-06 ENCOUNTER — Encounter: Payer: Self-pay | Admitting: Podiatry

## 2018-12-06 ENCOUNTER — Ambulatory Visit (INDEPENDENT_AMBULATORY_CARE_PROVIDER_SITE_OTHER): Payer: 59 | Admitting: Podiatry

## 2018-12-06 ENCOUNTER — Ambulatory Visit (INDEPENDENT_AMBULATORY_CARE_PROVIDER_SITE_OTHER): Payer: 59

## 2018-12-06 ENCOUNTER — Other Ambulatory Visit: Payer: Self-pay

## 2018-12-06 ENCOUNTER — Other Ambulatory Visit: Payer: Self-pay | Admitting: Podiatry

## 2018-12-06 VITALS — Temp 98.4°F | Resp 16

## 2018-12-06 DIAGNOSIS — Z9889 Other specified postprocedural states: Secondary | ICD-10-CM

## 2018-12-06 DIAGNOSIS — S92061D Displaced intraarticular fracture of right calcaneus, subsequent encounter for fracture with routine healing: Secondary | ICD-10-CM

## 2018-12-06 MED ORDER — PREGABALIN 50 MG PO CAPS: 50.0000 mg | ORAL_CAPSULE | Freq: Three times a day (TID) | ORAL | 0 refills | Status: AC

## 2018-12-06 NOTE — Progress Notes (Signed)
Subjective:  Patient ID: Manuel Stevenson, male    DOB: 06/09/1970,  MRN: [id number]  Chief Complaint  Patient presents with  . Routine Post Op    Pt. states," it still swells and moderate pain shoots thorught heel and radiates up the leg; 7/10 pain." Tx: PT, boot, cruthces, gab and tramadol - w/ swlling    DOS: 09/08/2018 Procedure: R Calcaneus ORIF  48 y.o. male returns for post-op check. Hx as above. Still having significant nerve pain. Working with PT but having difficulty WB.  Review of Systems: Negative except as noted in the HPI. Denies N/V/F/Ch.  No past medical history on file.  Current Outpatient Medications:  .  amoxicillin (AMOXIL) 500 MG tablet, amoxicillin 500 mg tablet  Take 2 tablets every 8 hours by oral route as directed for 10 days., Disp: , Rfl:  .  carvedilol (COREG) 6.25 MG tablet, Take 6.25 mg by mouth 2 (two) times daily., Disp: , Rfl:  .  cephALEXin (KEFLEX) 500 MG capsule, Take 1 capsule (500 mg total) by mouth 2 (two) times daily., Disp: 14 capsule, Rfl: 0 .  gabapentin (NEURONTIN) 300 MG capsule, Take 1 capsule (300 mg total) by mouth 4 (four) times daily., Disp: 60 capsule, Rfl: 0 .  HYDROcodone-acetaminophen (NORCO) 5-325 MG tablet, Take 1 tablet by mouth every 4 (four) hours as needed for moderate pain., Disp: 12 tablet, Rfl: 0 .  lisinopril (ZESTRIL) 10 MG tablet, lisinopril 10 mg tablet  Take 1 tablet every day by oral route., Disp: , Rfl:  .  omeprazole (PRILOSEC) 10 MG capsule, omeprazole 10 mg capsule,delayed release  Take 2 capsules every day by oral route., Disp: , Rfl:  .  ondansetron (ZOFRAN) 4 MG tablet, Take 1 tablet (4 mg total) by mouth every 8 (eight) hours as needed for nausea or vomiting., Disp: 20 tablet, Rfl: 0 .  oxyCODONE (ROXICODONE) 5 MG immediate release tablet, Take 1 tablet (5 mg total) by mouth every 4 (four) hours as needed for severe pain., Disp: 20 tablet, Rfl: 0 .  oxyCODONE-acetaminophen (PERCOCET) 10-325 MG tablet, Take 1 tablet  by mouth every 4 (four) hours as needed for pain., Disp: 20 tablet, Rfl: 0 .  pregabalin (LYRICA) 50 MG capsule, Take 1 capsule (50 mg total) by mouth 3 (three) times daily., Disp: 90 capsule, Rfl: 0 .  tadalafil (CIALIS) 5 MG tablet, Take 5 mg by mouth daily., Disp: , Rfl:  .  testosterone cypionate (DEPOTESTOSTERONE CYPIONATE) 200 MG/ML injection, INJECT 0.4 ML INTRAMUSCULARLY ONCE A WEEK, Disp: , Rfl:  .  traMADol (ULTRAM) 50 MG tablet, Take 1 tablet (50 mg total) by mouth every 6 (six) hours as needed., Disp: 30 tablet, Rfl: 0  Social History   Tobacco Use  Smoking Status Never Smoker  Smokeless Tobacco Never Used    Allergies  Allergen Reactions  . Codeine Other (See Comments) and Nausea And Vomiting   Objective:   Vitals:   12/06/18 1105  Resp: 16  Temp: 98.4 F (36.9 C)   There is no height or weight on file to calculate BMI. Constitutional Well developed. Well nourished.  Vascular Foot warm and well perfused. Capillary refill normal to all digits.   Neurologic Normal speech. Oriented to person, place, and time. Epicritic sensation to light touch grossly present bilaterally. Diminished over sural nerve distribution  Dermatologic Small superficial dehiscence without warmth erythema signs of infection.  Orthopedic: Tenderness to palpation noted about the surgical site. Good subtalar motion without crepitus. 3/5 MMT DF/PF/Inv/Ev  Radiographs: Taken and reviewed fracture appears to be healed hardware intact Assessment:   1. Post-operative state   2. Closed displaced intra-articular fracture of right calcaneus with routine healing, subsequent encounter    Plan:  Patient was evaluated and treated and all questions answered.  S/p foot surgery right -XR: Reviewed as above. -WB Status: 2 crutch assist WB in CAM boot RLE -Continue PT. Transition WB with PT. HIS PROM and AROM is reduced I think he is not ready to WB until these are strengthened -Discussed need to   -Wound scab removed, silver nitrate applied.  Sural Neuritis -Switch to Lyrica -Rx topical neuropathic pain cream  Return in about 2 weeks (around 12/20/2018). F/u in 2 weeks, no XRs

## 2018-12-09 ENCOUNTER — Telehealth: Payer: Self-pay | Admitting: *Deleted

## 2018-12-09 ENCOUNTER — Other Ambulatory Visit: Payer: Self-pay | Admitting: Podiatry

## 2018-12-09 DIAGNOSIS — Z9889 Other specified postprocedural states: Secondary | ICD-10-CM

## 2018-12-09 DIAGNOSIS — S92061D Displaced intraarticular fracture of right calcaneus, subsequent encounter for fracture with routine healing: Secondary | ICD-10-CM

## 2018-12-09 NOTE — Telephone Encounter (Signed)
[employer] states she needs to discuss alternate medication for pt's prescription.

## 2018-12-09 NOTE — Telephone Encounter (Signed)
Left message for Colletta [place] to fax prescription question to [phone] and I would review for Dr. March Rummage.

## 2018-12-20 ENCOUNTER — Encounter: Payer: Self-pay | Admitting: Podiatry

## 2018-12-20 ENCOUNTER — Other Ambulatory Visit: Payer: Self-pay

## 2018-12-20 ENCOUNTER — Ambulatory Visit (INDEPENDENT_AMBULATORY_CARE_PROVIDER_SITE_OTHER): Payer: 59 | Admitting: Podiatry

## 2018-12-20 VITALS — Temp 97.7°F | Resp 16

## 2018-12-20 DIAGNOSIS — Z9889 Other specified postprocedural states: Secondary | ICD-10-CM

## 2018-12-21 ENCOUNTER — Encounter: Payer: Self-pay | Admitting: Podiatry

## 2018-12-24 ENCOUNTER — Encounter: Payer: Self-pay | Admitting: Podiatry

## 2019-01-11 ENCOUNTER — Other Ambulatory Visit: Payer: Self-pay

## 2019-01-11 ENCOUNTER — Ambulatory Visit (INDEPENDENT_AMBULATORY_CARE_PROVIDER_SITE_OTHER): Payer: 59 | Admitting: Podiatry

## 2019-01-11 DIAGNOSIS — M722 Plantar fascial fibromatosis: Secondary | ICD-10-CM

## 2019-01-11 DIAGNOSIS — Z9889 Other specified postprocedural states: Secondary | ICD-10-CM

## 2019-01-11 DIAGNOSIS — S92061D Displaced intraarticular fracture of right calcaneus, subsequent encounter for fracture with routine healing: Secondary | ICD-10-CM

## 2019-01-18 NOTE — Progress Notes (Addendum)
Subjective:  Patient ID: Manuel Stevenson, male    DOB: [birth year],  MRN: [id number]  Chief Complaint  Patient presents with  . Routine Post Op    pt is here for routine post op, pt states that he still has a hard time putting pressure on it, physical therapy is going well as well    DOS: 09/08/2018 Procedure: R Calcaneus ORIF  48 y.o. male returns for post-op check. Hx as above.  Has been weightbearing in the boot with some pain and difficulty.  Not using crutches for assistance.  States that the sensation to the fourth toe is returning.  Has had pain to the bottom of the right heel as he is putting more weight on the foot.  Review of Systems: Negative except as noted in the HPI. Denies N/V/F/Ch.  No past medical history on file.  Current Outpatient Medications:  .  amoxicillin (AMOXIL) 500 MG tablet, amoxicillin 500 mg tablet  Take 2 tablets every 8 hours by oral route as directed for 10 days., Disp: , Rfl:  .  calcipotriene (DOVONOX) 0.005 % ointment, , Disp: , Rfl:  .  carvedilol (COREG) 6.25 MG tablet, Take 6.25 mg by mouth 2 (two) times daily., Disp: , Rfl:  .  cephALEXin (KEFLEX) 500 MG capsule, Take 1 capsule (500 mg total) by mouth 2 (two) times daily., Disp: 14 capsule, Rfl: 0 .  Diclofenac Sodium POWD, , Disp: , Rfl:  .  gabapentin (NEURONTIN) 300 MG capsule, Take 1 capsule (300 mg total) by mouth 4 (four) times daily., Disp: 60 capsule, Rfl: 0 .  HYDROcodone-acetaminophen (NORCO) 5-325 MG tablet, Take 1 tablet by mouth every 4 (four) hours as needed for moderate pain., Disp: 12 tablet, Rfl: 0 .  lisinopril (ZESTRIL) 10 MG tablet, lisinopril 10 mg tablet  Take 1 tablet every day by oral route., Disp: , Rfl:  .  omeprazole (PRILOSEC) 10 MG capsule, omeprazole 10 mg capsule,delayed release  Take 2 capsules every day by oral route., Disp: , Rfl:  .  ondansetron (ZOFRAN) 4 MG tablet, Take 1 tablet (4 mg total) by mouth every 8 (eight) hours as needed for nausea or vomiting., Disp: 20  tablet, Rfl: 0 .  oxyCODONE (ROXICODONE) 5 MG immediate release tablet, Take 1 tablet (5 mg total) by mouth every 4 (four) hours as needed for severe pain., Disp: 20 tablet, Rfl: 0 .  oxyCODONE-acetaminophen (PERCOCET) 10-325 MG tablet, Take 1 tablet by mouth every 4 (four) hours as needed for pain., Disp: 20 tablet, Rfl: 0 .  pregabalin (LYRICA) 50 MG capsule, Take 1 capsule (50 mg total) by mouth 3 (three) times daily., Disp: 90 capsule, Rfl: 0 .  tadalafil (CIALIS) 5 MG tablet, Take 5 mg by mouth daily., Disp: , Rfl:  .  testosterone cypionate (DEPOTESTOSTERONE CYPIONATE) 200 MG/ML injection, INJECT 0.4 ML INTRAMUSCULARLY ONCE A WEEK, Disp: , Rfl:  .  traMADol (ULTRAM) 50 MG tablet, Take 1 tablet (50 mg total) by mouth every 6 (six) hours as needed., Disp: 30 tablet, Rfl: 0  Social History   Tobacco Use  Smoking Status Never Smoker  Smokeless Tobacco Never Used    Allergies  Allergen Reactions  . Codeine Other (See Comments) and Nausea And Vomiting   Objective:   There were no vitals filed for this visit. There is no height or weight on file to calculate BMI. Constitutional Well developed. Well nourished.  Vascular Foot warm and well perfused. Capillary refill normal to all digits.   Neurologic  Normal speech. Oriented to person, place, and time. Epicritic sensation to light touch grossly present bilaterally. Diminished over sural nerve distribution, slight sensation present to the right fourth toe  Dermatologic  incision remains healed  Orthopedic: Tenderness to palpation noted about the surgical site. Good subtalar motion without crepitus. 3/5 MMT DF/PF/Inv/Ev Pedal patient but the medial calcaneal tuber right   Radiographs: Taken and reviewed fracture appears to be healed hardware intact Assessment:   1. Plantar fasciitis   2. Post-operative state   3. Closed displaced intra-articular fracture of right calcaneus with routine healing, subsequent encounter    Plan:   Patient was evaluated and treated and all questions answered.  S/p foot surgery right -Continue work on weightbearing with physical therapy. -Still having a lot of difficulty advised him to slowly transition as comfortable  Sural Neuritis -Has been improving somewhat.  Sensation return to the fourth toe area slightly.  Expect this to improve with time  Plantar fasciitis -Injection delivered to the right heel  Procedure: Injection Tendon/Ligament Consent: Verbal consent obtained. Location: Right plantar fascia at the glabrous junction; medial approach. Skin Prep: Alcohol. Injectate: 1 cc 0.5% marcaine plain, 1 cc dexamethasone phosphate, 0.5 cc kenalog 10. Disposition: Patient tolerated procedure well. Injection site dressed with a band-aid.    No follow-ups on file. F/u in 2 weeks, no XRs

## 2019-01-19 ENCOUNTER — Telehealth: Payer: Self-pay | Admitting: *Deleted

## 2019-01-19 NOTE — Telephone Encounter (Signed)
[employer] they have faxed refill request to our office.

## 2019-02-01 ENCOUNTER — Other Ambulatory Visit: Payer: Self-pay

## 2019-02-01 ENCOUNTER — Ambulatory Visit (INDEPENDENT_AMBULATORY_CARE_PROVIDER_SITE_OTHER): Payer: 59 | Admitting: Podiatry

## 2019-02-01 DIAGNOSIS — Z9889 Other specified postprocedural states: Secondary | ICD-10-CM

## 2019-02-01 DIAGNOSIS — M722 Plantar fascial fibromatosis: Secondary | ICD-10-CM

## 2019-02-17 DIAGNOSIS — I2510 Atherosclerotic heart disease of native coronary artery without angina pectoris: Principal | ICD-10-CM

## 2019-02-17 MED ORDER — LISINOPRIL 10 MG TABLET: 10 mg | tablet | Freq: Two times a day (BID) | 0 refills | 30 days | Status: AC

## 2019-02-21 ENCOUNTER — Other Ambulatory Visit: Payer: Self-pay

## 2019-02-21 ENCOUNTER — Ambulatory Visit (INDEPENDENT_AMBULATORY_CARE_PROVIDER_SITE_OTHER): Payer: 59 | Admitting: Podiatry

## 2019-02-21 ENCOUNTER — Encounter
Admit: 2019-02-21 | Discharge: 2019-02-22 | Payer: BLUE CROSS/BLUE SHIELD | Attending: Sports Medicine | Primary: Sports Medicine

## 2019-02-21 DIAGNOSIS — M25571 Pain in right ankle and joints of right foot: Secondary | ICD-10-CM

## 2019-02-21 DIAGNOSIS — K2100 Gastro-esophageal reflux disease with esophagitis, without bleeding: Secondary | ICD-10-CM | POA: Insufficient documentation

## 2019-02-21 DIAGNOSIS — G5791 Unspecified mononeuropathy of right lower limb: Secondary | ICD-10-CM | POA: Diagnosis not present

## 2019-02-21 MED ORDER — OMEPRAZOLE 20 MG CAPSULE,DELAYED RELEASE
ORAL_CAPSULE | Freq: Every day | ORAL | 3 refills | 90.00000 days | Status: CP | PRN
Start: 2019-02-21 — End: ?

## 2019-02-21 MED ORDER — LISINOPRIL 10 MG TABLET
ORAL_TABLET | Freq: Two times a day (BID) | ORAL | 0 refills | 30.00000 days | Status: CP
Start: 2019-02-21 — End: ?

## 2019-02-21 MED ORDER — CARVEDILOL 12.5 MG TABLET
ORAL_TABLET | Freq: Two times a day (BID) | ORAL | 1 refills | 90 days | Status: CP
Start: 2019-02-21 — End: 2019-05-22

## 2019-02-21 MED ORDER — CARVEDILOL 6.25 MG TABLET
ORAL_TABLET | Freq: Two times a day (BID) | ORAL | 3 refills | 90.00000 days | Status: CP
Start: 2019-02-21 — End: 2019-02-21

## 2019-02-21 NOTE — Progress Notes (Signed)
Subjective:  Patient ID: Manuel Stevenson, male    DOB: 1970-06-17,  MRN: [id number]  Chief Complaint  Patient presents with  . Routine Post Op    Pt. states," it still hurts, no improvement, it hurts all the time; 7/101 shapr pains." -worse late evening -w. swellign Tx: icing, aleve and IBU   . Plantar Fasciitis    no better with Rt bottom heel and numbness     DOS: 09/08/2018 Procedure: R Calcaneus ORIF  48 y.o. male returns for post-op check. Hx as above.  Still having considerable pain today that burning pain is not as much as he is having some pain with a lot of activity.  Review of Systems: Negative except as noted in the HPI. Denies N/V/F/Ch.  No past medical history on file.  Current Outpatient Medications:  .  amoxicillin (AMOXIL) 500 MG tablet, amoxicillin 500 mg tablet  Take 2 tablets every 8 hours by oral route as directed for 10 days., Disp: , Rfl:  .  calcipotriene (DOVONOX) 0.005 % ointment, , Disp: , Rfl:  .  carvedilol (COREG) 6.25 MG tablet, Take 6.25 mg by mouth 2 (two) times daily., Disp: , Rfl:  .  cephALEXin (KEFLEX) 500 MG capsule, Take 1 capsule (500 mg total) by mouth 2 (two) times daily., Disp: 14 capsule, Rfl: 0 .  Diclofenac Sodium POWD, , Disp: , Rfl:  .  gabapentin (NEURONTIN) 300 MG capsule, Take 1 capsule (300 mg total) by mouth 4 (four) times daily., Disp: 60 capsule, Rfl: 0 .  HYDROcodone-acetaminophen (NORCO) 5-325 MG tablet, Take 1 tablet by mouth every 4 (four) hours as needed for moderate pain., Disp: 12 tablet, Rfl: 0 .  lisinopril (ZESTRIL) 10 MG tablet, lisinopril 10 mg tablet  Take 1 tablet every day by oral route., Disp: , Rfl:  .  omeprazole (PRILOSEC) 10 MG capsule, omeprazole 10 mg capsule,delayed release  Take 2 capsules every day by oral route., Disp: , Rfl:  .  ondansetron (ZOFRAN) 4 MG tablet, Take 1 tablet (4 mg total) by mouth every 8 (eight) hours as needed for nausea or vomiting., Disp: 20 tablet, Rfl: 0 .  oxyCODONE (ROXICODONE) 5 MG  immediate release tablet, Take 1 tablet (5 mg total) by mouth every 4 (four) hours as needed for severe pain., Disp: 20 tablet, Rfl: 0 .  oxyCODONE-acetaminophen (PERCOCET) 10-325 MG tablet, Take 1 tablet by mouth every 4 (four) hours as needed for pain., Disp: 20 tablet, Rfl: 0 .  pregabalin (LYRICA) 50 MG capsule, Take 1 capsule (50 mg total) by mouth 3 (three) times daily., Disp: 90 capsule, Rfl: 0 .  tadalafil (CIALIS) 5 MG tablet, Take 5 mg by mouth daily., Disp: , Rfl:  .  testosterone cypionate (DEPOTESTOSTERONE CYPIONATE) 200 MG/ML injection, INJECT 0.4 ML INTRAMUSCULARLY ONCE A WEEK, Disp: , Rfl:  .  traMADol (ULTRAM) 50 MG tablet, Take 1 tablet (50 mg total) by mouth every 6 (six) hours as needed., Disp: 30 tablet, Rfl: 0  Social History   Tobacco Use  Smoking Status Never Smoker  Smokeless Tobacco Never Used    Allergies  Allergen Reactions  . Codeine Other (See Comments) and Nausea And Vomiting   Objective:   There were no vitals filed for this visit. There is no height or weight on file to calculate BMI. Constitutional Well developed. Well nourished.  Vascular Foot warm and well perfused. Capillary refill normal to all digits.   Neurologic Normal speech. Oriented to person, place, and time. Epicritic sensation  to light touch grossly present bilaterally. Diminished over sural nerve distribution, slight sensation present to the right fourth toe  Dermatologic  incision remains healed  Orthopedic: Tenderness to palpation noted about the surgical site. Good subtalar motion without crepitus. 3/5 MMT DF/PF/Inv/Ev. : Only mild pain with inversion   Radiographs: None  Assessment:   1. Sinus tarsitis, right   2. Neuritis of right foot    Plan:  Patient was evaluated and treated and all questions answered.  S/p foot surgery right -Still having considerable pain though not significant portion of it as well inversion eversion. -Injection delivered to the sinus tarsi -Trial  CMOS to see if they can help alleviate some of the pain. Scanned today -Should pain persist despite all conservative therapy may need subtalar fusion later date  Procedure: Injection Intermediate Joint Consent: Verbal consent obtained. Location: Right sinus tarsi. Skin Prep: alcohol. Injectate: 1 cc 0.5% marcaine plain, 1 cc dexamethasone phosphate, 0.5 cc kenalog 10. Disposition: Patient tolerated procedure well. Injection site dressed with a band-aid.  Sural Neuritis -Has been improving.  Repeat injection along the incision to break up scar tissue around the sural nerve branch  Procedure: Nerve Injection Location: Right sural nerve Skin Prep: Alcohol. Injectate: 0.5 cc 0.5% marcaine plain, 0.5 cc dexamethasone phosphate. Disposition: Patient tolerated procedure well. Injection site dressed with a band-aid.    No follow-ups on file. F/u in 2 weeks, no XRs

## 2019-02-22 DIAGNOSIS — R7303 Prediabetes: Secondary | ICD-10-CM | POA: Insufficient documentation

## 2019-02-22 MED ORDER — EZETIMIBE 10 MG TABLET
ORAL_TABLET | Freq: Every day | ORAL | 11 refills | 30.00000 days | Status: CP
Start: 2019-02-22 — End: 2020-02-22

## 2019-02-22 MED ORDER — LEVOTHYROXINE 112 MCG TABLET
ORAL_TABLET | Freq: Every day | ORAL | 5 refills | 60 days | Status: CP
Start: 2019-02-22 — End: 2020-02-22

## 2019-03-10 ENCOUNTER — Encounter: Admit: 2019-03-10 | Discharge: 2019-03-10 | Payer: BLUE CROSS/BLUE SHIELD

## 2019-03-10 ENCOUNTER — Telehealth
Admit: 2019-03-10 | Discharge: 2019-03-10 | Payer: BLUE CROSS/BLUE SHIELD | Attending: Registered" | Primary: Registered"

## 2019-03-10 DIAGNOSIS — I2510 Atherosclerotic heart disease of native coronary artery without angina pectoris: Principal | ICD-10-CM

## 2019-03-10 DIAGNOSIS — I209 Angina pectoris, unspecified: Principal | ICD-10-CM

## 2019-03-10 MED ORDER — EVOLOCUMAB 140 MG/ML SUBCUTANEOUS PEN INJECTOR
INJECTION | SUBCUTANEOUS | 3 refills | 0 days | Status: CP
Start: 2019-03-10 — End: ?
  Filled 2019-03-16: qty 2, 28d supply, fill #0

## 2019-03-10 MED ORDER — LISINOPRIL 10 MG TABLET
ORAL_TABLET | Freq: Two times a day (BID) | ORAL | 3 refills | 90.00000 days | Status: CP
Start: 2019-03-10 — End: ?

## 2019-03-14 ENCOUNTER — Encounter: Admit: 2019-03-14 | Discharge: 2019-03-14 | Payer: BLUE CROSS/BLUE SHIELD

## 2019-03-14 ENCOUNTER — Encounter: Admit: 2019-03-14 | Discharge: 2019-03-14 | Payer: BLUE CROSS/BLUE SHIELD | Attending: Urology | Primary: Urology

## 2019-03-14 DIAGNOSIS — N529 Male erectile dysfunction, unspecified: Principal | ICD-10-CM

## 2019-03-14 DIAGNOSIS — N400 Enlarged prostate without lower urinary tract symptoms: Principal | ICD-10-CM

## 2019-03-14 DIAGNOSIS — R7989 Other specified abnormal findings of blood chemistry: Principal | ICD-10-CM

## 2019-03-14 DIAGNOSIS — E291 Testicular hypofunction: Principal | ICD-10-CM

## 2019-03-14 MED ORDER — TESTOSTERONE CYPIONATE 200 MG/ML INTRAMUSCULAR OIL
INTRAMUSCULAR | 1 refills | 140 days | Status: CP
Start: 2019-03-14 — End: ?

## 2019-03-14 MED ORDER — TADALAFIL 5 MG TABLET
ORAL_TABLET | Freq: Every day | ORAL | 3 refills | 90.00000 days | Status: CP
Start: 2019-03-14 — End: 2020-03-08

## 2019-03-14 MED ORDER — SYRINGE WITH NEEDLE 3 ML 23 GAUGE X 1 1/2"
INJECTION | 11 refills | 0 days | Status: CP
Start: 2019-03-14 — End: ?

## 2019-03-14 NOTE — Unmapped (Signed)
Assessment:  History of urethrocutaneous fistula s/p repair. Doing well.  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  Hypogonadism on IM TC.    Discussion:  We extensively discussed the etiology, natural history and management of erectile dysfunction.     We discussed the importance of lifestyle and dietary habits including weight loss, exercise, blood pressure and glucose control.    We discussed the use and risks of PDE 5 inhibitors, such as Viagra, Levitra, Cialis and Stendra. Side effects include but are not limited to headache, flushing, abdominal or back pain, palpitations, dyspepsia, blue-tinged vision, vision or hearing loss which may be permanent, and priapism (an erection longer than 4 hours that requires a visit to the emergency room).    We discussed vacuum erection devices, including their use and risks, intraurethral MUSE pellets and risks including but not limited to priapism and urethral burning.     We discussed intracavernosal injections (ICI), as well as the risks of bleeding, pain, penile scarring with curvature, damage to surrounding tissues (i.e., urethra), and systemic cardiovascular effects We discussed penile prosthesis in detail. Risks include those risks related to anesthesia required for the surgery, bleeding, infection requiring explantation, malfunction, pain, SST deformity of the glans penis, erosion, and the likely inability of being able to salvage any erectile function if the device needs to be removed and not replaced. The function of the internal penile pump (implant) was discussed with the patient. Possible surgical, medical, and device related risks and complications include but are not limited to: infections, erosion of the implant through the skin, urethra, and small bowel intestine, colon, or injury to artery or vein of lower extremity, temporary severe pain in the surgical area and mechanical failure of the implant. We also discussed more life-threatening complications such as deep venous thrombosis, pulmonary embolism, stroke, heart attack, bowel injury, major vascular injury, or death. I explained that infection is more common with revision surgery and that even with sterile technique and antibiotic washout protocols, the risk of infection is not zero. We discussed specific IPP complications including but not limited to corporal perforation, cylinder cross-over, SST deformity, and mechanical malfunction. Loss of remaining spontaneous erectile ability will also occur after an implant. Other risks of a general nature inherent in any type of surgical procedure such as a bad reaction to anesthesia, contracting pneumonia, phlebitis, or other dangers arising from existing heart problems. We also discussed the possible need for further surgery if complication of dissatisfaction occurs. Should an infection occur after the prosthesis is inserted, the patient will need to be hospitalized and the device removed. A salvage procedure may or may not be possible with re-insertion of another device immediately at the same time that the infected device is removed. This is performed in order to prevent shrinkage and scarring of the penis which occurs after penile prosthesis infection. If the type of bacteria causing the infection is not identified, the patient may require 2-3 weeks of IV antibiotics. We discussed risks related to removing old reservoirs, including injury to bowel, bladder, vasculature. In some cases, we may determine that the best option is to drain and retain. In these cases, the reservoir is left and placed and decommissioned rather than risk removal. Even in cases where removing the reservoir is felt to be safe, injury may occur. We discussed long-term durability and the fact that the penis may not be completely concealable or flaccid as a natural penis. The length of the erect prosthesis will  approximate stretched penile length, which will likely be shorter than his natural erection. Glans does not engorge with an implant.     It is sometimes necessary to perform adjunctive maneuvers in order to implant an IPP satisfactorily. This is rare but includes modeling (straightening a curved penis over an inflated prosthesis), incisions with or without grafting (covering consisting typically of cadaver heart tissue), plication (pleats in the erectile cylinders to address curvature), corporoplasty (incision near the head of the penis in order to redirect the implant into the appropriate space), glansplasty (securing the head of the penis to the tips of the erectile cylinders to prevent it from pointing downward), and scrotoplasty (excision of a small amount of the scrotum to address webbing).     We discussed peri-operative course in detail. He should be prepared to stay overnight, but if he is doing well will be discharged that day. Normally the prosthesis is activated 4 weeks later. Importance of not shaving the genital region pre-operatively and of cleaning the area thoroughly pre-operative was stressed.     We dicussed device selection, and he would an AMS device.    Plan:  IPP Surgery Scheduling Checklist    Surgery (to be used for verify informed consent): Insertion of inflatable penile prosthesis  Date of surgery: Next available Friday    Pre-op: No  Pre-care: No  Blood thinners: ASA (81)  Labwork: None  Post-op appt: 4 weeks Verlon Au  Other pre-op needs: None  Case length: IPP (180 with turnover)  Priority level: Semi-elective  Risk score: 14    We discussed the additional risks related to undergoing a procedure during the COVID19 pandemic, including:      - The lack of information on the true risks during this pandemic.      - The uncertain (but likely increased) risk of nosocomial infection with COVID-19 given the hospital environment and the inability to social distance during a procedure.      - The possible impact of pandemic-associated resource shortages and changes in hospital operations on the postoperative care and experience.      - The possible complications from a COVID-19 infection.     The patient has had an opportunity to ask questions, and these questions were answered to satisfaction.  The patient expressed understanding.    Message sent to [facility] to post/schedule COVID test: Yes  Case request ordered: Yes     HPI:   48 y.o. male     s/p UC fistula repair 12/01/17.  Last visit 03/08/18. At that time, doing well. Plan: FU as needed.  I did message his cardiologist, Dr. Willa Rough, about nitroglycerin use.   He wrote me back and said it's OK to use Cialis or similar agents.    Interval history:  Has been seeing Verlon Au for hypogonadism. On TC 80mg  weekly. Low T and HG symptoms. Her plan is to check labs and increase dose.  On tadalafil daily for ED, though rigidity not sufficient and doesn't last long enough for intercourse.  Uses Trimix occasionally, 0.39mL.  Previously tried sildenafil, not effective. Referred for consideration of IPP.    --  Reports that he is urinating OK. Occasional weak stream, but typically good stream, no spraying. Viagra 100mg  doesn't work. Sometimes no effect, sometimes effect in 5-6 hours. Even then, not good and firm.  Has been taking daily cialis + 5mg  on demand, not effective.  I just want to get my confidence back    Past history reviewed and unchanged  ROS:   A comprehensive 10-system review was negative, except as noted in HPI.  The patient was asked to review all abnormal responses not pertinent to today's visit with their primary care physician.    There were no vitals taken for this visit.    Physical Exam:    General: well developed, well nourished, no acute distress  HEENT: PERLA, EOM intact, normocephalic, atraumatic  Neck: supple and no masses  Chest: symmetrical  Lungs: non-labored breathing  Heart: normal rhythm, no JVD  Abdomen: no tenderness, no masses or hernias, no palpable organomegaly  GU: nl phallus, test/epid bilaterally. Demonstrated SPL.  Extremities: no deformities, no edema, no cyanosis

## 2019-03-15 MED ORDER — EMPTY CONTAINER
3 refills | 0 days
Start: 2019-03-15 — End: ?

## 2019-03-15 NOTE — Unmapped (Signed)
[facility]   Patient Onboarding/Medication Counseling    Tony Perkins is a 48 y.o. male with hyperlipidemia who I am counseling today on initiation of therapy.  I am speaking to the patient.    Verified patient's date of birth / HIPAA.    Specialty medication(s) to be sent: General Specialty: Repatha      Non-specialty medications/supplies to be sent: sharps      Medications not needed at this time: none         Repatha (evolocumab)    Medication & Administration     Dosage: Inject the contents of 1 pen (140mg ) under the skin every 2 weeks.    Administration: Administer under the skin of the abdomen, thigh or upper arm. Rotate sites with each injection.  ? Injection instructions - Autoinjector:  o Remove 1 Repatha autoinjector from the refrigerator and let stand at room temperature for at least 30 minutes.  o Check the autoinjector for the following:  ? Expiration date  ? Absence of any cracks or damage  ? The medicine is clear and colorless and does not contain any particles  ? The orange cap is present and securely attached  o Choose your injection site and clean with an alcohol wipe. Allow to air dry completely.  o Pull the orange cap straight off and discard  o Pinch the skin (or stretch) with your thumb and fingers creating an area 2 inches wide  o Maintaining the pinch (or stretch) press the pen to your skin at a 90 degree angle. Firmly push the autoinjector down until the skin stops moving and the yellow safety guard is no longer visible.  o Do not touch the gray start button yet  o When you are ready to inject, press the gray start button. You will hear a click that signals the start of the injection  o Continue to press the pen to your skin and lift your thumb  o The injection may take up to 15 seconds. You will know the injection is complete when the medication window turns yellow. You may also hear a second click. o Remove the pen from your skin and discard the pen in a sharps container.  o If there is blood at the injection site, press a cotton ball or gauze to the site. Do not rub the injection site.    Adherence/Missed dose instructions: Administer a missed dose within 7 days and resume your normal schedule.  If it has been more than 7 days and you inject every 2 weeks, skip the missed dose and resume your normal schedule..     Goals of Therapy     Lower cholesterol, prevention of cardiovascular events in patients with established cardiovascular disease    Side Effects & Monitoring Parameters   ? Flu-like symptoms  ? Signs of a common cold  ? Back pain  ? Injection site irritation  ? Nose or throat irritation    The following side effects should be reported to the provider:  ? Signs of an allergic reaction  ? Signs of high blood sugar (confusion, drowsiness, increase thirst/hunger/urination, fast breathing, flushing)      Contraindications, Warnings, & Precautions     ? Latex (the packaging of Repatha may contain natural rubber)    Drug/Food Interactions     ? Medication list reviewed in Epic. The patient was instructed to inform the care team before taking any new medications or supplements. No drug interactions identified.  Storage, Handling Precautions, & Disposal   ? Repatha should be stored in the refrigerator. If necessary, Repatha may be kept at room temperature for no more than 30 days.  ? Place used devices in a sharps container for disposal.      Current Medications (including OTC/herbals), Comorbidities and Allergies     Current Outpatient Medications   Medication Sig Dispense Refill   ??? ascorbic acid (VITAMIN C) 1000 MG tablet Take 1,000 mg by mouth Two (2) times a day.      ??? aspirin (ECOTRIN) 81 MG tablet Take 81 mg by mouth daily.     ??? carvediloL (COREG) 12.5 MG tablet Take 1 tablet (12.5 mg total) by mouth Two (2) times a day. 180 tablet 1 ??? evolocumab 140 mg/mL PnIj Inject the contents of 1 pen (140 mg) under the skin every fourteen (14) days. 6 mL 3   ??? ezetimibe (ZETIA) 10 mg tablet Take 1 tablet (10 mg total) by mouth daily. 30 tablet 11   ??? levothyroxine (SYNTHROID) 112 MCG tablet Take 1 tablet (112 mcg total) by mouth daily. 60 tablet 5   ??? lisinopriL (PRINIVIL,ZESTRIL) 10 MG tablet Take 1 tablet (10 mg total) by mouth two (2) times a day. 180 tablet 3   ??? omeprazole (PRILOSEC) 20 MG capsule Take 1 capsule (20 mg total) by mouth daily as needed. 90 capsule 3   ??? syringe with needle (TERUMO SYRINGE) 3 mL 23 gauge x 1 1/2 Syrg 1 Syringe by Miscellaneous route once a week. 25 Syringe 11   ??? tadalafiL (CIALIS) 5 MG tablet Take 1 tablet (5 mg total) by mouth daily. 90 tablet 3   ??? testosterone cypionate (DEPOTESTOTERONE CYPIONATE) 200 mg/mL injection Inject 0.5 mL (100 mg total) into the muscle once a week. 10 mL 1     No current facility-administered medications for this visit.        Allergies   Allergen Reactions   ??? Codeine Nausea And Vomiting       Patient Active Problem List   Diagnosis   ??? Cervical pain (neck)   ??? Lumbar radiculitis   ??? Type 2 Diabetes in Remission   ??? Essential hypertension   ??? Hypothyroidism (acquired)   ??? Esophageal reflux   ??? Essential tremor   ??? Ganglion   ??? Onychia and paronychia of toe   ??? Primary localized osteoarthrosis, hand   ??? Hypogonadism male   ??? Sarcoidosis   ??? Postlaminectomy syndrome   ??? Urolithiasis   ??? Chronic back pain   ??? Lumbar post-laminectomy syndrome   ??? Spinal cord stimulator status   ??? Fibrous non-union   ??? Palpitations   ??? History of coronary artery disease   ??? Acute frontal sinusitis   ??? Erectile dysfunction   ??? Lower urinary tract symptoms (LUTS)   ??? Other headache syndrome   ??? OSA (obstructive sleep apnea)   ??? Other hyperlipidemia   ??? Needs flu shot   ??? Low testosterone in male   ??? Urethrocutaneous fistula   ??? Acute otitis externa of right ear   ??? Angina pectoris (CMS-HCC)   ??? Hyperlipidemia ??? Class 2 severe obesity due to excess calories with serious comorbidity in adult (CMS-HCC)   ??? Gastroesophageal reflux disease with esophagitis   ??? Prediabetes       Reviewed and up to date in Epic.    Appropriateness of Therapy     Is medication and dose appropriate based on diagnosis? Yes  Baseline Quality of Life Assessment      How many days over the past month did your hyperlipidemia keep you from your normal activities? 0    Financial Information     Medication Assistance provided: Prior Authorization    Anticipated copay of $0 reviewed with patient. Verified delivery address.    Delivery Information     Scheduled delivery date: 03/17/19    Expected start date: 03/17/19    Medication will be delivered via UPS to the prescription address in [facility].  This shipment will not require a signature.      Explained the services we provide at [facility] and that each month we would call to set up refills.  Stressed importance of returning phone calls so that we could ensure they receive their medications in time each month.  Informed patient that we should be setting up refills 7-10 days prior to when they will run out of medication.  A pharmacist will reach out to perform a clinical assessment periodically.  Informed patient that a welcome packet and a drug information handout will be sent.      Patient verbalized understanding of the above information as well as how to contact the pharmacy at [phone] option [phone] with any questions/concerns.  The pharmacy is open Monday through Friday 8:30am-4:30pm.  A pharmacist is available 24/7 via pager to answer any clinical questions they may have.    Patient Specific Needs     ? Does the patient have any physical, cognitive, or cultural barriers? No    ? Patient prefers to have medications discussed with  Patient     ? Is the patient able to read and understand education materials at a high school level or above? Yes ? Patient's primary language is  English     ? Is the patient high risk? No     ? Does the patient require a Care Management Plan? No     ? Does the patient require physician intervention or other additional services (i.e. nutrition, smoking cessation, social work)? No      Arnold Long  [facility] Specialty Pharmacist

## 2019-03-15 NOTE — Unmapped (Signed)
[facility] Specialty Medication Onboarding    Specialty Medication: Careers adviser  Prior Authorization: Approved   Financial Assistance: No - copay  <$25  Final Copay/Day Supply: $0 / 28 DAYS    Insurance Restrictions: Yes - max 1 month supply     Notes to Pharmacist:     The triage team has completed the benefits investigation and has determined that the patient is able to fill this medication at [facility]. Please contact the patient to complete the onboarding or follow up with the prescribing physician as needed.

## 2019-03-16 MED FILL — EMPTY CONTAINER: 120 days supply | Qty: 1 | Fill #0 | Status: AC

## 2019-03-16 MED FILL — REPATHA SURECLICK 140 MG/ML SUBCUTANEOUS PEN INJECTOR: 28 days supply | Qty: 2 | Fill #0 | Status: AC

## 2019-03-16 MED FILL — EMPTY CONTAINER: 120 days supply | Qty: 1 | Fill #0

## 2019-03-17 NOTE — Unmapped (Signed)
Confirmed 1/8 procedure date and 1/6 covid date

## 2019-04-04 ENCOUNTER — Ambulatory Visit (INDEPENDENT_AMBULATORY_CARE_PROVIDER_SITE_OTHER): Payer: 59 | Admitting: Podiatry

## 2019-04-04 ENCOUNTER — Other Ambulatory Visit: Payer: Self-pay

## 2019-04-04 DIAGNOSIS — G5791 Unspecified mononeuropathy of right lower limb: Secondary | ICD-10-CM | POA: Diagnosis not present

## 2019-04-04 DIAGNOSIS — M25571 Pain in right ankle and joints of right foot: Secondary | ICD-10-CM | POA: Diagnosis not present

## 2019-04-04 MED ORDER — ONDANSETRON HCL 4 MG PO TABS: 4.0000 mg | ORAL_TABLET | Freq: Three times a day (TID) | ORAL | 0 refills | Status: DC | PRN

## 2019-04-04 NOTE — Unmapped (Signed)
[facility] Clinical Assessment & Refill Coordination Note    Tony Perkins, DOB: [birth year]  Phone: [phone] (home)     All above HIPAA information was verified with patient.     Was a Nurse, learning disability used for this call? No    Specialty Medication(s):   General Specialty: Repatha     Current Outpatient Medications   Medication Sig Dispense Refill   ??? ascorbic acid (VITAMIN C) 1000 MG tablet Take 1,000 mg by mouth Two (2) times a day.      ??? aspirin (ECOTRIN) 81 MG tablet Take 81 mg by mouth daily.     ??? carvediloL (COREG) 12.5 MG tablet Take 1 tablet (12.5 mg total) by mouth Two (2) times a day. 180 tablet 1   ??? empty container Misc Use as directed to dispose of injectable medications 1 each 3   ??? evolocumab 140 mg/mL PnIj Inject the contents of 1 pen (140 mg) under the skin every fourteen (14) days. 6 mL 3   ??? ezetimibe (ZETIA) 10 mg tablet Take 1 tablet (10 mg total) by mouth daily. 30 tablet 11   ??? levothyroxine (SYNTHROID) 112 MCG tablet Take 1 tablet (112 mcg total) by mouth daily. 60 tablet 5   ??? lisinopriL (PRINIVIL,ZESTRIL) 10 MG tablet Take 1 tablet (10 mg total) by mouth two (2) times a day. 180 tablet 3   ??? omeprazole (PRILOSEC) 20 MG capsule Take 1 capsule (20 mg total) by mouth daily as needed. 90 capsule 3   ??? syringe with needle (TERUMO SYRINGE) 3 mL 23 gauge x 1 1/2 Syrg 1 Syringe by Miscellaneous route once a week. 25 Syringe 11   ??? tadalafiL (CIALIS) 5 MG tablet Take 1 tablet (5 mg total) by mouth daily. 90 tablet 3   ??? testosterone cypionate (DEPOTESTOTERONE CYPIONATE) 200 mg/mL injection Inject 0.5 mL (100 mg total) into the muscle once a week. 10 mL 1     No current facility-administered medications for this visit.         Changes to medications: Tony Perkins reports no changes at this time.    Allergies   Allergen Reactions   ??? Codeine Nausea And Vomiting       Changes to allergies: No    SPECIALTY MEDICATION ADHERENCE Repatha 140 mg/ml: 0 days of medicine on hand       Medication Adherence    Patient reported X missed doses in the last month: 0  Specialty Medication: Repatha  Patient is on additional specialty medications: No  Informant: patient          Specialty medication(s) dose(s) confirmed: Regimen is correct and unchanged.     Are there any concerns with adherence? No    Adherence counseling provided? Not needed    CLINICAL MANAGEMENT AND INTERVENTION      Clinical Benefit Assessment:    Do you feel the medicine is effective or helping your condition? Unable to determine at this time - blood work has not been done since starting    Clinical Benefit counseling provided? Not needed    Adverse Effects Assessment:    Are you experiencing any side effects? Yes, patient reports experiencing fatigue, achiness. Side effect counseling provided: he is already injecting at night and the symptoms were much shorter in duration with his second dose.    Are you experiencing difficulty administering your medicine? No    Quality of Life Assessment:    How many days over the past month  did your hyperlipidemia  keep you from your normal activities? For example, brushing your teeth or getting up in the morning. 0    Have you discussed this with your provider? Not needed    Therapy Appropriateness:    Is therapy appropriate? Yes, therapy is appropriate and should be continued    DISEASE/MEDICATION-SPECIFIC INFORMATION      For patients on injectable medications: Patient currently has 0 doses left.  Next injection is scheduled for 04/16/19.    PATIENT SPECIFIC NEEDS     ? Does the patient have any physical, cognitive, or cultural barriers? No    ? Is the patient high risk? No     ? Does the patient require a Care Management Plan? No     ? Does the patient require physician intervention or other additional services (i.e. nutrition, smoking cessation, social work)? No      SHIPPING     Specialty Medication(s) to be Shipped: General Specialty: Repatha    Other medication(s) to be shipped: none     Changes to insurance: No    Delivery Scheduled: Yes, Expected medication delivery date: 04/12/19.     Medication will be delivered via UPS to the confirmed prescription address in [facility].    The patient will receive a drug information handout for each medication shipped and additional FDA Medication Guides as required.  Verified that patient has previously received a Conservation officer, historic buildings.    All of the patient's questions and concerns have been addressed.    Arnold Long   [facility] Specialty Pharmacist

## 2019-04-04 NOTE — Patient Instructions (Signed)

## 2019-04-04 NOTE — Progress Notes (Signed)
Subjective:  Patient ID: Manuel Stevenson, male    DOB: March 27, 1971,  MRN: [id number]  Chief Complaint  Patient presents with  . Routine Post Op    POV Pt. states foot bothers me, it urst usually by noon it's painful: 7-8/10 shapr pains." -w/ swellign Tx: tyleol and IBU   . Foot Orthotics    PUO -PT states orthotics feels fine     DOS: 09/08/2018 Procedure: R Calcaneus ORIF  48 y.o. male returns for post-op check. Hx as above.    Review of Systems: Negative except as noted in the HPI. Denies N/V/F/Ch.  No past medical history on file.  Current Outpatient Medications:  .  Evolocumab 140 MG/ML SOAJ, Inject into the skin., Disp: , Rfl:  .  ezetimibe (ZETIA) 10 MG tablet, Take by mouth., Disp: , Rfl:  .  levothyroxine (SYNTHROID) 112 MCG tablet, Take by mouth., Disp: , Rfl:  .  calcipotriene (DOVONOX) 0.005 % ointment, , Disp: , Rfl:  .  carvedilol (COREG) 6.25 MG tablet, Take 6.25 mg by mouth 2 (two) times daily., Disp: , Rfl:  .  Diclofenac Sodium POWD, , Disp: , Rfl:  .  gabapentin (NEURONTIN) 300 MG capsule, Take 1 capsule (300 mg total) by mouth 4 (four) times daily., Disp: 60 capsule, Rfl: 0 .  HYDROcodone-acetaminophen (NORCO) 5-325 MG tablet, Take 1 tablet by mouth every 4 (four) hours as needed for moderate pain., Disp: 12 tablet, Rfl: 0 .  lisinopril (ZESTRIL) 10 MG tablet, lisinopril 10 mg tablet  Take 1 tablet every day by oral route., Disp: , Rfl:  .  omeprazole (PRILOSEC) 10 MG capsule, omeprazole 10 mg capsule,delayed release  Take 2 capsules every day by oral route., Disp: , Rfl:  .  omeprazole (PRILOSEC) 20 MG capsule, Take 20 mg by mouth daily as needed., Disp: , Rfl:  .  ondansetron (ZOFRAN) 4 MG tablet, Take 1 tablet (4 mg total) by mouth every 8 (eight) hours as needed for nausea or vomiting., Disp: 20 tablet, Rfl: 0 .  oxyCODONE (ROXICODONE) 5 MG immediate release tablet, Take 1 tablet (5 mg total) by mouth every 4 (four) hours as needed for severe pain., Disp: 20  tablet, Rfl: 0 .  oxyCODONE-acetaminophen (PERCOCET) 10-325 MG tablet, Take 1 tablet by mouth every 4 (four) hours as needed for pain., Disp: 20 tablet, Rfl: 0 .  pregabalin (LYRICA) 50 MG capsule, Take 1 capsule (50 mg total) by mouth 3 (three) times daily., Disp: 90 capsule, Rfl: 0 .  tadalafil (CIALIS) 5 MG tablet, Take 5 mg by mouth daily., Disp: , Rfl:  .  testosterone cypionate (DEPOTESTOSTERONE CYPIONATE) 200 MG/ML injection, INJECT 0.4 ML INTRAMUSCULARLY ONCE A WEEK, Disp: , Rfl:  .  traMADol (ULTRAM) 50 MG tablet, Take 1 tablet (50 mg total) by mouth every 6 (six) hours as needed., Disp: 30 tablet, Rfl: 0  Social History   Tobacco Use  Smoking Status Never Smoker  Smokeless Tobacco Never Used    Allergies  Allergen Reactions  . Codeine Other (See Comments) and Nausea And Vomiting   Objective:   There were no vitals filed for this visit. There is no height or weight on file to calculate BMI. Constitutional Well developed. Well nourished.  Vascular Foot warm and well perfused. Capillary refill normal to all digits.   Neurologic Normal speech. Oriented to person, place, and time. Epicritic sensation to light touch grossly present bilaterally. Diminished over sural nerve distribution, slight sensation present to the right fourth toe  Dermatologic Incision remains healed  Orthopedic: Tenderness to palpation noted about the surgical site. Good subtalar motion without crepitus. 3/5 MMT DF/PF/Inv/Ev. : Only mild pain with inversion   Radiographs: None  Assessment:   No diagnosis found. Plan:  Patient was evaluated and treated and all questions answered.  S/p foot surgery right -Trial CMOS to see if they can help alleviate some of the pain. Dispensed today -Should pain persist despite all conservative therapy may need subtalar fusion later date  Procedure: Injection Intermediate Joint Consent: Verbal consent obtained. Location: Right sinus tarsi. Skin Prep: alcohol.  Injectate: 1 cc 0.5% marcaine plain, 1 cc dexamethasone phosphate, 0.5 cc kenalog 10. Disposition: Patient tolerated procedure well. Injection site dressed with a band-aid.  Sural Neuritis -Chronic, should improve with time.    Return in about 4 weeks (around 05/02/2019) for Post-Op (No XRs). F/u in 2 weeks, no XRs

## 2019-04-10 NOTE — Progress Notes (Signed)
Subjective:  Patient ID: Manuel Stevenson, male    DOB: May 30, 1970,  MRN: [id number]  Chief Complaint  Patient presents with  . Routine Post Op    Pt. states," still with swelling in the evenings and moderate pains every day; 6-7/10 shapr pains." -pain wakes the pt up at nights -pt deneis redness -w/ swelling and warmth Tx: PT, icing and tylenol   . neuritis    F/U neuritis Pt. states,' it still bothers me the same."     DOS: 09/08/2018 Procedure: R Calcaneus ORIF  48 y.o. male returns for post-op check. Hx as above.   Review of Systems: Negative except as noted in the HPI. Denies N/V/F/Ch.  No past medical history on file.  Current Outpatient Medications:  .  amoxicillin-clavulanate (AUGMENTIN) 875-125 MG tablet, , Disp: , Rfl:  .  ascorbic acid (VITAMIN C) 1000 MG tablet, Take by mouth., Disp: , Rfl:  .  calcipotriene (DOVONOX) 0.005 % ointment, , Disp: , Rfl:  .  carvedilol (COREG) 6.25 MG tablet, Take 6.25 mg by mouth 2 (two) times daily., Disp: , Rfl:  .  Diclofenac Sodium POWD, , Disp: , Rfl:  .  Evolocumab 140 MG/ML SOAJ, Inject into the skin., Disp: , Rfl:  .  ezetimibe (ZETIA) 10 MG tablet, Take by mouth., Disp: , Rfl:  .  gabapentin (NEURONTIN) 300 MG capsule, Take 1 capsule (300 mg total) by mouth 4 (four) times daily., Disp: 60 capsule, Rfl: 0 .  HYDROcodone-acetaminophen (NORCO) 5-325 MG tablet, Take 1 tablet by mouth every 4 (four) hours as needed for moderate pain., Disp: 12 tablet, Rfl: 0 .  levothyroxine (SYNTHROID) 112 MCG tablet, Take by mouth., Disp: , Rfl:  .  lisinopril (ZESTRIL) 10 MG tablet, lisinopril 10 mg tablet  Take 1 tablet every day by oral route., Disp: , Rfl:  .  methylPREDNISolone (MEDROL DOSEPAK) 4 MG TBPK tablet, 6 Day Taper Pack. Take as Directed., Disp: 21 tablet, Rfl: 0 .  omeprazole (PRILOSEC) 10 MG capsule, omeprazole 10 mg capsule,delayed release  Take 2 capsules every day by oral route., Disp: , Rfl:  .  omeprazole (PRILOSEC) 20 MG capsule,  Take 20 mg by mouth daily as needed., Disp: , Rfl:  .  ondansetron (ZOFRAN) 4 MG tablet, Take 1 tablet (4 mg total) by mouth every 8 (eight) hours as needed for nausea or vomiting., Disp: 20 tablet, Rfl: 0 .  ondansetron (ZOFRAN-ODT) 4 MG disintegrating tablet, , Disp: , Rfl:  .  oxyCODONE (ROXICODONE) 5 MG immediate release tablet, Take 1 tablet (5 mg total) by mouth every 4 (four) hours as needed for severe pain., Disp: 20 tablet, Rfl: 0 .  oxyCODONE-acetaminophen (PERCOCET) 10-325 MG tablet, Take 1 tablet by mouth every 4 (four) hours as needed for pain., Disp: 20 tablet, Rfl: 0 .  pregabalin (LYRICA) 50 MG capsule, Take 1 capsule (50 mg total) by mouth 3 (three) times daily., Disp: 90 capsule, Rfl: 0 .  tadalafil (CIALIS) 5 MG tablet, Take 5 mg by mouth daily., Disp: , Rfl:  .  testosterone cypionate (DEPOTESTOSTERONE CYPIONATE) 200 MG/ML injection, INJECT 0.4 ML INTRAMUSCULARLY ONCE A WEEK, Disp: , Rfl:  .  traMADol (ULTRAM) 50 MG tablet, Take 1 tablet (50 mg total) by mouth every 6 (six) hours as needed., Disp: 30 tablet, Rfl: 0  Social History   Tobacco Use  Smoking Status Never Smoker  Smokeless Tobacco Never Used    Allergies  Allergen Reactions  . Codeine Other (See Comments) and Nausea  And Vomiting   Objective:   There were no vitals filed for this visit. There is no height or weight on file to calculate BMI. Constitutional Well developed. Well nourished.  Vascular Foot warm and well perfused. Capillary refill normal to all digits.   Neurologic Normal speech. Oriented to person, place, and time. Epicritic sensation to light touch grossly present bilaterally. Diminished over sural nerve distribution, slight sensation present to the right fourth toe  Dermatologic  incision remains healed  Orthopedic: Tenderness to palpation noted about the surgical site. Good subtalar motion without crepitus. 3/5 MMT DF/PF/Inv/Ev Pedal patient but the medial calcaneal tuber right    Radiographs: Taken and reviewed fracture appears to be healed hardware intact Assessment:   1. Plantar fasciitis   2. Post-operative state    Plan:  Patient was evaluated and treated and all questions answered.  S/p foot surgery right -Continue work on weightbearing with physical therapy. -Still having a lot of difficulty advised him to slowly transition as comfortable  Sural Neuritis -Stable slowly improving   No follow-ups on file. F/u in 2 weeks, no XRs

## 2019-04-11 MED FILL — REPATHA SURECLICK 140 MG/ML SUBCUTANEOUS PEN INJECTOR: 28 days supply | Qty: 2 | Fill #1 | Status: AC

## 2019-04-11 MED FILL — REPATHA SURECLICK 140 MG/ML SUBCUTANEOUS PEN INJECTOR: SUBCUTANEOUS | 28 days supply | Qty: 2 | Fill #1

## 2019-04-19 ENCOUNTER — Telehealth: Payer: Self-pay | Admitting: Urology

## 2019-04-19 NOTE — Telephone Encounter (Signed)
Pt needs a refill on topical pain cream. [facility] am, # [phone] ext [phone], [place]

## 2019-04-19 NOTE — Telephone Encounter (Signed)
Called in refill. Left VM as extension not available.

## 2019-04-19 NOTE — Telephone Encounter (Signed)
Thank you :)

## 2019-05-01 DIAGNOSIS — N529 Male erectile dysfunction, unspecified: Principal | ICD-10-CM

## 2019-05-01 NOTE — Unmapped (Signed)
Urologic Surgery History and Physical Note        Assessment   49 yo M with ED presenting for IPP.       Plan:    - NPO status to be confirmed by anesthesia.  - Risk of the surgery will be discussed in the pre-op holding area and informed consent will be obtained accordingly.  - Vital signs and exam to be performed in pre-operative holding.   - Pending above actions, will proceed to OR as planned        History of Present Illness:     Chief Complaint:  Erectile dysfunction    See prior urology notes for details.    History of urethrocutaneous fistula s/p repair. Doing well.  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  Hypogonadism on IM TC.      Allergies  Reviewed in EMR      Medications      Reviewed in EMR.     Past Medical History, Past Surgical History, Social History, and Family History: Reviewed in EMR.       Physical Exam:  Vital signs and physical exam to be obtained in pre-operative holding.     Labs and Studies    Labs: No new studies.    Imaging: No new studies.

## 2019-05-02 ENCOUNTER — Ambulatory Visit (INDEPENDENT_AMBULATORY_CARE_PROVIDER_SITE_OTHER): Payer: 59 | Admitting: Podiatry

## 2019-05-02 ENCOUNTER — Telehealth: Admit: 2019-05-02 | Discharge: 2019-05-03 | Payer: BLUE CROSS/BLUE SHIELD

## 2019-05-02 DIAGNOSIS — Z5329 Procedure and treatment not carried out because of patient's decision for other reasons: Secondary | ICD-10-CM

## 2019-05-02 DIAGNOSIS — Z01818 Encounter for other preprocedural examination: Principal | ICD-10-CM

## 2019-05-02 DIAGNOSIS — G4733 Obstructive sleep apnea (adult) (pediatric): Principal | ICD-10-CM

## 2019-05-02 DIAGNOSIS — Z8679 Personal history of other diseases of the circulatory system: Principal | ICD-10-CM

## 2019-05-02 NOTE — Unmapped (Signed)
I called the patient to confirm urology surgery next week. I reminded them of their mandatory preoperative COVID test appointment. All their questions were answered. They were very appreciative of call.

## 2019-05-02 NOTE — Unmapped (Signed)
TC to patient to schedule virtual visit ( video). Patient confirms access to smartphone, call or text [phone]) for visit. Patient is aware they will receive three calls near appointment time: one from registration, one from Nurse and one (call or texted link for video) from Provider.    Add on today at 12PM

## 2019-05-02 NOTE — Unmapped (Signed)
Per Anesthesia's guidelines:    Please take the following medications the morning of your procedure with a sip of water:      Carvedilol, Levothyroxine

## 2019-05-02 NOTE — Unmapped (Signed)
[facility] Refill Coordination Note    Specialty Medication(s) to be Shipped:   General Specialty: Repatha    Other medication(s) to be shipped:       Tony Perkins, DOB: 12-13-1970  Phone: [phone] (home)       All above HIPAA information was verified with patient.     Was a Nurse, learning disability used for this call? No    Completed refill call assessment today to schedule patient's medication shipment from the [facility] [phone]).       Specialty medication(s) and dose(s) confirmed: Regimen is correct and unchanged.   Changes to medications: Tony Perkins reports no changes at this time.  Changes to insurance: No  Questions for the pharmacist: No    Confirmed patient received Welcome Packet with first shipment. The patient will receive a drug information handout for each medication shipped and additional FDA Medication Guides as required.       DISEASE/MEDICATION-SPECIFIC INFORMATION        For patients on injectable medications: Patient currently has 0 doses left.  Next injection is scheduled for 01/16.    SPECIALTY MEDICATION ADHERENCE     Medication Adherence    Patient reported X missed doses in the last month: 0  Specialty Medication: repatha 140mg /ml  Patient is on additional specialty medications: No  Informant: patient                repatha 140 mg/ml: 0 days of medicine on hand         SHIPPING     Shipping address confirmed in Epic.     Delivery Scheduled: Yes, Expected medication delivery date: 01/12.     Medication will be delivered via UPS to the prescription address in Epic WAM.    Antonietta Barcelona   [facility] Specialty Technician

## 2019-05-02 NOTE — Progress Notes (Signed)
Patient had to cancel appt because he was called into work.

## 2019-05-02 NOTE — Progress Notes (Deleted)
  Subjective:  Patient ID: Manuel Stevenson, male    DOB: Mar 05, 1971,  MRN: [id number]  No chief complaint on file.  49 y.o. male presents with the above complaint. History confirmed with patient. ***  Objective:  Physical Exam: warm, good capillary refill, no trophic changes or ulcerative lesions, normal DP and PT pulses, and normal sensory exam. Left Foot: normal exam, no swelling, tenderness, instability; ligaments intact, full range of motion of all ankle/foot joints  Right Foot: normal exam, no swelling, tenderness, instability; ligaments intact, full range of motion of all ankle/foot joints   No images are attached to the encounter.  Radiographs: *** X-ray of {right/left foot:16097}: no fracture, dislocation, swelling or degenerative changes noted   Assessment:  No diagnosis found.   Plan:  Patient was evaluated and treated and all questions answered.  {Pod Diagnoses:23508} {Pod Plans:23486}  No follow-ups on file.   MDM

## 2019-05-03 ENCOUNTER — Encounter: Admit: 2019-05-03 | Discharge: 2019-05-03 | Payer: BLUE CROSS/BLUE SHIELD | Attending: Family | Primary: Family

## 2019-05-03 ENCOUNTER — Ambulatory Visit: Admit: 2019-05-03 | Discharge: 2019-05-04 | Payer: BLUE CROSS/BLUE SHIELD | Attending: Family | Primary: Family

## 2019-05-03 DIAGNOSIS — J988 Other specified respiratory disorders: Principal | ICD-10-CM

## 2019-05-03 DIAGNOSIS — U071 COVID-19: Principal | ICD-10-CM

## 2019-05-03 NOTE — Unmapped (Signed)
Name:  Tony Perkins  DOB: 1970/12/19  Today's Date: 05/03/2019  Age:  49 y.o.    Today's Visit Included:   Indication for Testing: Patient is scheduled for an upcoming procedure, surgery or treatment requiring pre-testing for COVID-19.    Informed the patient that their surgeon will be notified of results in 24-48 hours.     Advised patient to self-isolate and remain in his/her home until the surgery to minimize risk of COVID-19 infection prior to surgery.    Also advised patient to notify his/her surgeon/specialist immediately if the patient develops any new symptoms such as:  []  Subjective fever  []  Chills  []  Muscle aches  []  Runny nose  []  Sore throat  []  Loss of taste or smell   []  Cough (new or worsening of chronic cough)  []  Shortness of breath  []  Diarrhea (3 or more loose stools in the last 24 hours)  []  Fatigue (new or worsening)  []  Abdominal pain  []  Nausea or vomiting  []  Headache     Advised all household members and close contacts to remain in the house as much as possible as well.     COVID-19 testing performed.    [name] Carmelia Bake, LPN

## 2019-05-06 ENCOUNTER — Encounter
Admit: 2019-05-06 | Discharge: 2019-05-06 | Payer: BLUE CROSS/BLUE SHIELD | Attending: Anesthesiology | Primary: Anesthesiology

## 2019-05-06 ENCOUNTER — Ambulatory Visit: Admit: 2019-05-06 | Discharge: 2019-05-06 | Payer: BLUE CROSS/BLUE SHIELD

## 2019-05-06 MED ORDER — ONDANSETRON 4 MG DISINTEGRATING TABLET
ORAL_TABLET | Freq: Three times a day (TID) | ORAL | 0 refills | 7 days | Status: CP | PRN
Start: 2019-05-06 — End: 2019-05-13
  Filled 2019-05-06: qty 20, 7d supply, fill #0

## 2019-05-06 MED ORDER — SENNOSIDES 8.6 MG-DOCUSATE SODIUM 50 MG TABLET
ORAL_TABLET | Freq: Every day | ORAL | 0 refills | 30 days | Status: CP
Start: 2019-05-06 — End: 2019-06-05
  Filled 2019-05-06: qty 30, 30d supply, fill #0

## 2019-05-06 MED ORDER — OXYCODONE 5 MG TABLET
ORAL_TABLET | ORAL | 0 refills | 2.00000 days | Status: CP | PRN
Start: 2019-05-06 — End: 2019-05-06

## 2019-05-06 MED ORDER — TRAMADOL 50 MG TABLET
ORAL_TABLET | Freq: Four times a day (QID) | ORAL | 0 refills | 5.00000 days | Status: CP | PRN
Start: 2019-05-06 — End: 2019-05-11
  Filled 2019-05-06: qty 20, 5d supply, fill #0

## 2019-05-06 MED ORDER — AMOXICILLIN 875 MG-POTASSIUM CLAVULANATE 125 MG TABLET
ORAL_TABLET | Freq: Every day | ORAL | 0 refills | 7 days | Status: CP
Start: 2019-05-06 — End: 2019-05-13
  Filled 2019-05-06: qty 7, 7d supply, fill #0

## 2019-05-06 MED FILL — STOOL SOFTENER-STIMULANT LAXATIVE 8.6 MG-50 MG TABLET: 30 days supply | Qty: 30 | Fill #0 | Status: AC

## 2019-05-06 MED FILL — ONDANSETRON 4 MG DISINTEGRATING TABLET: 7 days supply | Qty: 20 | Fill #0 | Status: AC

## 2019-05-06 MED FILL — AMOXICILLIN 875 MG-POTASSIUM CLAVULANATE 125 MG TABLET: 7 days supply | Qty: 7 | Fill #0 | Status: AC

## 2019-05-06 MED FILL — TRAMADOL 50 MG TABLET: 5 days supply | Qty: 20 | Fill #0 | Status: AC

## 2019-05-06 NOTE — Unmapped (Signed)
Hi Tabetha,     s/p IPP today  Please make him a post-op appointment.  Date: ~4 weeks  With Verlon Au if available, if not, ok with me.     Thank you!    Teofilo Pod, PA-C  [facility] at [facility] [phone]

## 2019-05-06 NOTE — Unmapped (Signed)
PREOPERATIVE DIAGNOSIS:  Erectile dysfunction due to arterial insufficiency    PREOPERATIVE DIAGNOSIS  Same    PROCEDURES PERFORMED  Multi-component IPP (09811)  Injection of corpus cavernosa with pharmacologic agent (91478)    SURGEONS  Surgeon(s) and Role:     * Turner Daniels, MD - Primary     * Elio Forget, MD - Resident - Assisting     * Osie Cheeks, MD - Fellow - Interventional     ANESTHESIA  Anesthesiologist: Guy Sandifer, MD  Anesthesia Technician: Laurin Coder; Renelda Loma  Anesthesia Provider: Minerva Ends, CRNA; Rubie Maid, CRNA    SPECIMENS  * No orders in the log *    IMPLANTS  Implant Name Type Inv. Item Serial No. Manufacturer Lot No. LRB No. Used Action   PROSTHESIS PENILE AMS LP RESERVOIR W/INHIBIZONE D-119 - [id number] Urological Implant PROSTHESIS PENILE AMS LP RESERVOIR W/INHIBIZONE D-119  [employer] [id number] N/A 1 Implanted   PROSTHESIS PENILE AMS LP RESERVOIR W/INHIBIZONE D-119 - [id number] Urological Implant PROSTHESIS PENILE AMS LP RESERVOIR W/INHIBIZONE D-119  [employer] [id number] N/A 1 Implanted   PROSTHESIS PENILE W/ PRECONNECTED PUMP 700CX18MM - [id number] Urological Implant PROSTHESIS PENILE W/ PRECONNECTED PUMP 700CX18MM  [employer] [id number] N/A 1 Implanted   EXTENDER REAR TIP 1CM FOR PENILE PROSTHESIS [place] - [id number] Urological Implant EXTENDER REAR TIP 1CM FOR PENILE PROSTHESIS [place]  [employer] [id number] N/A 1 Implanted       DRAINS  None    ESTIMATED BLOOD LOSS  20mL    COMPLICATIONS  None    CONDITION  Stable    FINDINGS   Cylinders: AMS CX 18cm   Rear tips (right): 1cm   Rear tips (left): 1cm   Reservoir volume: 90mL   Reservoir side: Left   Reservoir location: SOR    INDICATIONS FOR SURGERY Tony Perkins is a 49 y.o. male with erectile dysfunction refractory to medical management.  After extensive discussion of risks, benefits and alternatives he consented to and was eager to proceed with the procedure.    OPERATIVE TECHNIQUE     Informed consent was obtained. The patient's identity was confirmed in the pre-operative holding area, and he was brought back to the operating room by anesthesia. He voided to completion in the pre-operative holding area, confirmed on ultrasound. He was sedated and intubated. Broad-spectrum IV antibiotics were administered prior to incision.    The patient was positioned supine and frog-legged with one pillow supporting his knees and another pillow supporting his heels. He was clipped, and a chlorhexadine scrub was performed. He was then prepped with chloraprep and once this dried he was draped. Skin was prepped once again with chloraprep, and gloves were changed.    A 21-gauge butterfly needle was used to induce an artificial erection with of lidocaine/injectable saline. There was no curvature, narrowing or other abnormality.     A 4cm transverse incision was made. Dissection was carried through Dartos fascia. Corpora were exposed, and corporotomies were made. A corporotomy stay suture was placed with 2-0 Vicryl UR6. Measurements were taken. Proximal and distal corpora were irrigated with antibiotic solution. We confirmed that there was no crossover or urethral injury.    While the device was prepped, the space for the reservoir was created. It was filled, and we confirmed that there was no back pressure.     We then placed cylinders in the standard fashion. Cylinders were inflated. Distal tips were  well positioned in the mid glans. There was no bulging at the corporotomy sites. There was no curvature. Corporotomies were closed in a running watertight fashion with vicryl. Pump was placed in the scrotum. Connections were made. Device was cycled several times, and functioned well There was excellent hemostasis.    Dartos was closed in several layers in a running fashion. Skin was closed with 3-0 chromic and covered with skin glue. A Mummy wrap dressing was applied over the penis and scrotum with Kerlex.    The device was left fully deflated.    The procedure was tolerated well. There were no immediate complications.    I, Tony Perkins, was present, participating and scrubbed for the duration of the procedure.    PLAN  Remove mummy wrap in AM POD 2  Follow-up:   4 weeks on a Thursday with me or Verlon Au for post-op check   Do not use IPP until f/u appt   No bathing/swimming 4 weeks (showers are OK)

## 2019-05-09 ENCOUNTER — Other Ambulatory Visit: Payer: Self-pay

## 2019-05-09 ENCOUNTER — Ambulatory Visit (INDEPENDENT_AMBULATORY_CARE_PROVIDER_SITE_OTHER): Payer: 59 | Admitting: Podiatry

## 2019-05-09 DIAGNOSIS — G5791 Unspecified mononeuropathy of right lower limb: Secondary | ICD-10-CM | POA: Diagnosis not present

## 2019-05-09 DIAGNOSIS — M722 Plantar fascial fibromatosis: Secondary | ICD-10-CM

## 2019-05-09 DIAGNOSIS — M25571 Pain in right ankle and joints of right foot: Secondary | ICD-10-CM | POA: Diagnosis not present

## 2019-05-09 DIAGNOSIS — I2510 Atherosclerotic heart disease of native coronary artery without angina pectoris: Principal | ICD-10-CM

## 2019-05-09 MED ORDER — ONDANSETRON HCL 4 MG PO TABS: 4.0000 mg | ORAL_TABLET | Freq: Three times a day (TID) | ORAL | 0 refills | Status: AC | PRN

## 2019-05-09 MED FILL — REPATHA SURECLICK 140 MG/ML SUBCUTANEOUS PEN INJECTOR: 28 days supply | Qty: 2 | Fill #2 | Status: AC

## 2019-05-09 MED FILL — REPATHA SURECLICK 140 MG/ML SUBCUTANEOUS PEN INJECTOR: SUBCUTANEOUS | 28 days supply | Qty: 2 | Fill #2

## 2019-05-09 NOTE — Progress Notes (Signed)
  Subjective:  Patient ID: Manuel Stevenson, male    DOB: 02-Sep-1970,  MRN: [id number]  Chief Complaint  Patient presents with  . Routine Post Op    Pt. states," it still hurts as normal, tolerating orthotics pretty good; 3-4/10 pain." -woprse with prolong walking -w/ swellgin Tx: ROM, tylenol, IBU and tramadol -pt states he had hernia sx Friday     49 y.o. male presents with the above complaint. History confirmed with patient.   Objective:  Physical Exam: warm, good capillary refill, no trophic changes or ulcerative lesions, normal DP and PT pulses and reduced sensory exam Right Foot: normal exam, no swelling, tenderness, instability; ligaments intact, STJ slightly stiff.   Assessment:   1. Sinus tarsitis, right   2. Neuritis of right foot   3. Plantar fasciitis      Plan:  Patient was evaluated and treated and all questions answered.  Right Foot Calcaneus Fx, Sequela; Sural Neuritis -Still having nerve pain but otherwise improving  -Refill Zofran -Continue CMOs -F/u in 6 weeks.  No follow-ups on file.   MDM

## 2019-05-10 NOTE — Unmapped (Signed)
Scheduled 06/01/19

## 2019-05-18 NOTE — Unmapped (Signed)
Assessment:  ED s/p IPP 05/06/2019 Guy Sandifer)  Doing well post op but device activation deferred today given small area of superficial wound dehiscence. Exam not concerning for underlying infection.       Plan:  F/u 2/15 with Dr. Guy Sandifer per pt preference for device activation if incision fully healed   F/u with [facility] in Nov 2021 for hypogonadism     Referring Provider: NP Mellody Life    HPI:   49 y.o. male s/p the above procedure here for post op check.    Has been doing well since surgery. Pain and soreness overall improved. Notes some burning around his scrotum that is improved with NSAIDS.     Describes a superficial wound separation that he had been self treated with peroxide and bacitracin. No drainage. No f/c. Appearance has improve over the last few days.    A little hesitant to have examination and activation by male (thought he was going to see Dr. Guy Sandifer). Offered male IT consultant or rescheduling - he declined and would like to proceed with exam by this Clinical research associate.       Past history reviewed and unchanged    ROS:   A comprehensive 10-system review was negative, except as noted in HPI.  The patient was asked to review all abnormal responses not pertinent to today's visit with their primary care physician.       Wt 99.2 kg (218 lb 9.6 oz)  - BMI 36.38 kg/m??     Physical Exam:    General: well developed, well nourished, no acute distress  HEENT: PERLA, EOM intact, normocephalic, atraumatic  Neck: supple and no masses  Chest: symmetrical  Lungs: non-labored breathing  Heart: normal rate on vitals check   Abdomen: no tenderness   GU:  Incision healed except for a superficial dehiscence on the right lateral aspect as pictured. No tenderness/erythema/fluctuance around area.   Cylinders well positioned  Pump well positioned  Reservoir not palp  No erythema, induration, fluctuance, device tendernes  Device not cycled 2/2 wound dehiscence   Rectal: not performed  Extremities: no deformities, no edema, no cyanosis

## 2019-06-01 ENCOUNTER — Encounter
Admit: 2019-06-01 | Discharge: 2019-06-01 | Payer: BLUE CROSS/BLUE SHIELD | Attending: Physician Assistant | Primary: Physician Assistant

## 2019-06-01 DIAGNOSIS — N529 Male erectile dysfunction, unspecified: Principal | ICD-10-CM

## 2019-06-01 DIAGNOSIS — T8130XA Disruption of wound, unspecified, initial encounter: Principal | ICD-10-CM

## 2019-06-06 NOTE — Unmapped (Signed)
[facility] Refill Coordination Note    Specialty Medication(s) to be Shipped:   General Specialty: Repatha    Other medication(s) to be shipped: N/A     Theodore Demark, DOB: 06/19/1970  Phone: [phone] (home)       All above HIPAA information was verified with patient.     Was a Nurse, learning disability used for this call? No    Completed refill call assessment today to schedule patient's medication shipment from the [facility] [phone]).       Specialty medication(s) and dose(s) confirmed: Regimen is correct and unchanged.   Changes to medications: Jacon reports no changes at this time.  Changes to insurance: No  Questions for the pharmacist: No    Confirmed patient received Welcome Packet with first shipment. The patient will receive a drug information handout for each medication shipped and additional FDA Medication Guides as required.       DISEASE/MEDICATION-SPECIFIC INFORMATION        For patients on injectable medications: Patient currently has 0 doses left.  Next injection is scheduled for 06/18/19.    SPECIALTY MEDICATION ADHERENCE     Medication Adherence    Patient reported X missed doses in the last month: 0  Specialty Medication: Repatha  Patient is on additional specialty medications: No                Repatha 140 mg/ml: 0 days of medicine on hand          SHIPPING     Shipping address confirmed in Epic.     Delivery Scheduled: Yes, Expected medication delivery date: 06/14/19.     Medication will be delivered via UPS to the prescription address in Epic WAM.    Nancy Nordmann [facility] Specialty Technician

## 2019-06-13 ENCOUNTER — Encounter: Admit: 2019-06-13 | Discharge: 2019-06-14 | Payer: BLUE CROSS/BLUE SHIELD | Attending: Urology | Primary: Urology

## 2019-06-13 MED FILL — REPATHA SURECLICK 140 MG/ML SUBCUTANEOUS PEN INJECTOR: 28 days supply | Qty: 2 | Fill #3 | Status: AC

## 2019-06-13 MED FILL — REPATHA SURECLICK 140 MG/ML SUBCUTANEOUS PEN INJECTOR: SUBCUTANEOUS | 28 days supply | Qty: 2 | Fill #3

## 2019-06-13 NOTE — Unmapped (Signed)
Assessment:  s/p IPP 05/06/19  Doing well    Plan:  Fu as needed    HPI:   49 y.o. male     s/p IPP 05/06/19              Cylinders: AMS CX 18cm              Rear tips (right): 1cm              Rear tips (left): 1cm              Reservoir volume: 90mL              Reservoir side: Left              Reservoir location: Passenger transport manager 2/3. Doing well, small area of superficial wound dehiscence, so made appointment with me.    Interval history:  No major changes  Doing well  No pain  Used IPP successfully  No fevers/chills    Past history reviewed and unchanged    ROS:   A comprehensive 10-system review was negative, except as noted in HPI.  The patient was asked to review all abnormal responses not pertinent to today's visit with their primary care physician.    There were no vitals taken for this visit.    Physical Exam:    General: well developed, well nourished, no acute distress  HEENT: PERLA, EOM intact, normocephalic, atraumatic  Neck: supple and no masses  Chest: symmetrical  Lungs: non-labored breathing  Heart: normal rhythm, no JVD  Abdomen: no tenderness, no masses or hernias, no palpable organomegaly  GU:  Incision well healed  Cylinders well positioned  Pump well positioned  Reservoir not palp  No erythema, induration, fluctuance, device tendernes  Device cycles appropriately  Extremities: no deformities, no edema, no cyanosis

## 2019-06-20 ENCOUNTER — Ambulatory Visit: Payer: 59 | Admitting: Podiatry

## 2019-06-20 ENCOUNTER — Other Ambulatory Visit: Payer: Self-pay

## 2019-06-20 DIAGNOSIS — M19071 Primary osteoarthritis, right ankle and foot: Secondary | ICD-10-CM

## 2019-06-20 DIAGNOSIS — M25571 Pain in right ankle and joints of right foot: Secondary | ICD-10-CM

## 2019-06-20 NOTE — Progress Notes (Signed)
  Subjective:  Patient ID: Manuel Stevenson, male    DOB: 11/27/1970,  MRN: [id number]  Chief Complaint  Patient presents with  . neuritis    F/U Rt neuritis and fx Pt. states," it's till bothering me about the same ( 5-6/10 ) Pain now runs form the front of my foot towards the shin of my leg." tx: aleve and tylenol -w/ swellgin    49 y.o. male presents with the above complaint. History confirmed with patient.   Objective:  Physical Exam: warm, good capillary refill, no trophic changes or ulcerative lesions, normal DP and PT pulses and normal sensory exam. Left Foot: normal exam, no swelling, tenderness, instability; ligaments intact, full range of motion of all ankle/foot joints  Right Foot: pain at the sinus tarsi and the right ankle lateral gutter   Assessment:   1. Sinus tarsitis, right   2. Arthritis of right ankle    Plan:  Patient was evaluated and treated and all questions answered.  Arthritis, Subtalar/Ankle -Injections delivered to the sinus tarsi and ankle joint  Procedure: Injection Intermediate Joint Consent: Verbal consent obtained. Location: Right ankle, sinus tarsi joint  Skin Prep: Alcohol. Injectate: 1 cc 0.5% marcaine plain, 1 cc dexamethasone phosphate, 0.5 cc kenalog 10. Disposition: Patient tolerated procedure well. Injection site dressed with a band-aid.  Neuralgia -Improving  Return in about 6 weeks (around 08/01/2019) for Arthritis, neuralgia.

## 2019-06-21 ENCOUNTER — Ambulatory Visit: Payer: 59 | Admitting: Podiatry

## 2019-07-08 ENCOUNTER — Ambulatory Visit: Payer: Self-pay

## 2019-07-11 NOTE — Unmapped (Signed)
07/11/19 Spoke to patient who stated he had his first Covid vac dose and was feeling sick. Not to mention his Repatha  Injection gives him Flu symptoms so held off taking his injection for a few weeks. He asked me to call him back for an   Refill on 07/25/19.

## 2019-08-01 ENCOUNTER — Ambulatory Visit: Payer: 59 | Admitting: Podiatry

## 2019-08-08 ENCOUNTER — Ambulatory Visit: Payer: 59 | Admitting: Podiatry

## 2019-08-08 NOTE — Unmapped (Signed)
08/08/19 Patient was in the hospital. He stated he has extra pens due to the stay. He would like for me to call him back on 08/16/19. sed

## 2019-08-17 DIAGNOSIS — E785 Hyperlipidemia, unspecified: Principal | ICD-10-CM

## 2019-08-22 ENCOUNTER — Ambulatory Visit: Payer: 59 | Admitting: Podiatry

## 2019-08-29 NOTE — Unmapped (Signed)
08/29/19 Pt does not want to refill his medication at this time due to some side affects he has been getting since taking this medication. He said he get an sore throat and mainly Flu like symptoms.  He said he is going to follow up with his provider soon to see what are his options. sed

## 2019-09-01 MED FILL — REPATHA SURECLICK 140 MG/ML SUBCUTANEOUS PEN INJECTOR: 28 days supply | Qty: 2 | Fill #4 | Status: AC

## 2019-09-01 MED FILL — REPATHA SURECLICK 140 MG/ML SUBCUTANEOUS PEN INJECTOR: SUBCUTANEOUS | 28 days supply | Qty: 2 | Fill #4

## 2019-09-01 NOTE — Unmapped (Signed)
[facility] Clinical Assessment & Refill Coordination Note    Tony Perkins, DOB: 1970-09-20  Phone: [phone] (home)     All above HIPAA information was verified with patient.     Was a Nurse, learning disability used for this call? No    Specialty Medication(s):   General Specialty: Repatha     Current Outpatient Medications   Medication Sig Dispense Refill   ??? ascorbic acid (VITAMIN C) 1000 MG tablet Take 1,000 mg by mouth Two (2) times a day.      ??? aspirin (ECOTRIN) 81 MG tablet Take 81 mg by mouth daily.     ??? carvediloL (COREG) 12.5 MG tablet Take 1 tablet (12.5 mg total) by mouth Two (2) times a day. 180 tablet 1   ??? empty container Misc Use as directed to dispose of injectable medications 1 each 3   ??? evolocumab 140 mg/mL PnIj Inject the contents of 1 pen (140 mg) under the skin every fourteen (14) days. 6 mL 3   ??? ezetimibe (ZETIA) 10 mg tablet Take 1 tablet (10 mg total) by mouth daily. 30 tablet 11   ??? levothyroxine (SYNTHROID) 112 MCG tablet Take 1 tablet (112 mcg total) by mouth daily. 60 tablet 5   ??? lisinopriL (PRINIVIL,ZESTRIL) 10 MG tablet Take 1 tablet (10 mg total) by mouth two (2) times a day. 180 tablet 3   ??? omeprazole (PRILOSEC) 20 MG capsule Take 1 capsule (20 mg total) by mouth daily as needed. 90 capsule 3   ??? syringe with needle (TERUMO SYRINGE) 3 mL 23 gauge x 1 1/2 Syrg 1 Syringe by Miscellaneous route once a week. 25 Syringe 11   ??? tadalafiL (CIALIS) 5 MG tablet Take 1 tablet (5 mg total) by mouth daily. 90 tablet 3   ??? testosterone cypionate (DEPOTESTOTERONE CYPIONATE) 200 mg/mL injection Inject 0.5 mL (100 mg total) into the muscle once a week. 10 mL 1     No current facility-administered medications for this visit.        Changes to medications: Tony Perkins reports no changes at this time.    Allergies   Allergen Reactions   ??? Codeine Nausea And Vomiting   ??? Dilaudid [Hydromorphone] Nausea And Vomiting       Changes to allergies: No    SPECIALTY MEDICATION ADHERENCE Repatha 140 mg/ml: 0 days of medicine on hand       Medication Adherence    Patient reported X missed doses in the last month: 2  Specialty Medication: Repatha  Patient is on additional specialty medications: No          Specialty medication(s) dose(s) confirmed: Regimen is correct and unchanged.     Are there any concerns with adherence? Yes: patient has been without medicine for ~1 month. He did not want to order further refills due to side effects    Adherence counseling provided? Yes: discussed that generally this medicine works really well for high cholesterol and it is important to continue until he discusses other options with his doctor.    CLINICAL MANAGEMENT AND INTERVENTION      Clinical Benefit Assessment:    Do you feel the medicine is effective or helping your condition? unknown - has not had labs since starting Repatha    Clinical Benefit counseling provided? Recommended that patient get labs    Adverse Effects Assessment:    Are you experiencing any side effects? Yes, patient reports experiencing cold/flu like symptoms lasting about 3 days  after injection. Side effect counseling provided: Patient reports side effects affecting his ability to work (especially since syptoms are similar to COVID). He will begin taking Repatha on Friday evenings so symtpoms will be gone by Monday.  He will also follow up with his provider to discuss alternate options to Repatha.    Are you experiencing difficulty administering your medicine? No    Quality of Life Assessment:    How many days over the past month did your high cholesterol  keep you from your normal activities? For example, brushing your teeth or getting up in the morning. Patient declined to answer    Have you discussed this with your provider? Not needed    Therapy Appropriateness:    Is therapy appropriate? Yes, therapy is appropriate and should be continued    DISEASE/MEDICATION-SPECIFIC INFORMATION      For patients on injectable medications: Patient currently has 0 doses left.  Next injection is scheduled for ASAP.    PATIENT SPECIFIC NEEDS     - Does the patient have any physical, cognitive, or cultural barriers? No    - Is the patient high risk? No     - Does the patient require a Care Management Plan? No     - Does the patient require physician intervention or other additional services (i.e. nutrition, smoking cessation, social work)? No      SHIPPING     Specialty Medication(s) to be Shipped:   General Specialty: Repatha    Other medication(s) to be shipped: n/a     Changes to insurance: No    Delivery Scheduled: Yes, Expected medication delivery date: 5/7.     Medication will be delivered via UPS to the confirmed prescription address in [facility].    The patient will receive a drug information handout for each medication shipped and additional FDA Medication Guides as required.  Verified that patient has previously received a Conservation officer, historic buildings.    All of the patient's questions and concerns have been addressed.    Clydell Hakim   [facility] Specialty Pharmacist

## 2019-09-12 ENCOUNTER — Ambulatory Visit: Payer: 59 | Admitting: Podiatry

## 2019-09-12 ENCOUNTER — Other Ambulatory Visit: Payer: Self-pay

## 2019-09-12 ENCOUNTER — Encounter: Payer: Self-pay | Admitting: Podiatry

## 2019-09-12 DIAGNOSIS — M19071 Primary osteoarthritis, right ankle and foot: Secondary | ICD-10-CM | POA: Diagnosis not present

## 2019-09-12 DIAGNOSIS — G5791 Unspecified mononeuropathy of right lower limb: Secondary | ICD-10-CM | POA: Diagnosis not present

## 2019-09-12 DIAGNOSIS — M25571 Pain in right ankle and joints of right foot: Secondary | ICD-10-CM

## 2019-09-12 MED ORDER — METHYLPREDNISOLONE 4 MG PO TBPK: ORAL_TABLET | ORAL | 0 refills | Status: AC

## 2019-09-12 NOTE — Progress Notes (Signed)
  Subjective:  Patient ID: TUCK DULWORTH, male    DOB: 1970-05-26,  MRN: [id number]  Chief Complaint  Patient presents with  . Foot Problem    the right foot is hurting and i feel that the heel goes out on me at work    49 y.o. male presents with the above complaint. History confirmed with patient.   Objective:  Physical Exam: warm, good capillary refill, no trophic changes or ulcerative lesions, normal DP and PT pulses and normal sensory exam. Left Foot: normal exam, no swelling, tenderness, instability; ligaments intact, full range of motion of all ankle/foot joints  Right Foot: pain at the sinus tarsi and the right ankle lateral gutter, pain medial calc tuber.   Assessment:   1. Sinus tarsitis, right   2. Arthritis of right ankle   3. Neuritis of right foot    Plan:  Patient was evaluated and treated and all questions answered.  Arthritis, Subtalar/Ankle -Defer injection today -Rx Medrol pack. -Discussed possible STJ fusion at later date.  Neuralgia -Continues to improve , albeit slowly  Return in about 6 weeks (around 10/24/2019).

## 2019-09-23 ENCOUNTER — Ambulatory Visit
Admit: 2019-09-23 | Discharge: 2019-09-24 | Payer: BLUE CROSS/BLUE SHIELD | Attending: Cardiovascular Disease | Primary: Cardiovascular Disease

## 2019-09-23 DIAGNOSIS — E785 Hyperlipidemia, unspecified: Principal | ICD-10-CM

## 2019-09-23 DIAGNOSIS — R7989 Other specified abnormal findings of blood chemistry: Principal | ICD-10-CM

## 2019-09-23 DIAGNOSIS — I2510 Atherosclerotic heart disease of native coronary artery without angina pectoris: Principal | ICD-10-CM

## 2019-09-23 LAB — NON-HDL CHOLESTEROL: Cholesterol.non HDL:MCnc:Pt:Ser/Plas:Qn:: 203 — ABNORMAL HIGH

## 2019-09-23 LAB — LIPID PANEL
CHOLESTEROL/HDL RATIO SCREEN: 5.2 — ABNORMAL HIGH (ref 1.0–4.5)
CHOLESTEROL: 251 mg/dL — ABNORMAL HIGH (ref <=200)
HDL CHOLESTEROL: 48 mg/dL (ref 40–60)
LDL CHOLESTEROL CALCULATED: 159 mg/dL — ABNORMAL HIGH (ref 40–100)
NON-HDL CHOLESTEROL: 203 mg/dL — ABNORMAL HIGH (ref 70–130)
VLDL CHOLESTEROL CAL: 44.2 mg/dL (ref 11–50)

## 2019-09-23 MED ORDER — ALIROCUMAB 150 MG/ML SUBCUTANEOUS PEN INJECTOR
SUBCUTANEOUS | 4 refills | 84 days | Status: CP
Start: 2019-09-23 — End: 2020-09-22
  Filled 2019-10-11: qty 2, 28d supply, fill #0

## 2019-09-23 NOTE — Unmapped (Signed)
Per test claim for Praluent at the [facility], patient needs Medication Assistance Program for Prior Authorization.

## 2019-09-23 NOTE — Unmapped (Signed)
Cardiology Consultation Note    Visit Date: Sep 23, 2019    Requesting Provider: Parke Poisson, MD   Primary Provider: Rolm Bookbinder, MD     Reason for Consult:   Hyperlipidemia     Assessment & Plan:  Mr. 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    1. Hyperlipidemia, unspecified hyperlipidemia type  Significant history or intolerance to statins, failing multiple trails due to persistent side effects of LE cramps and weakness. Now has side effects (flu-like symptoms, fever, sore throat, fatigue) from Rapatha that interfere with his ability to perform work related duties. Continues to tolerate Zetia 10 mg daily. His LDL at the visit today is improved to 131 from 192 on 02/21/2019 and triglycerides improved to 245 from 283. Unfortunately, his LDL is still above our goal range for him. We had a thorough discussion of his side effects from both statins and PCSK-9. We are hopeful that he will tolerate an alternative PCSK-9 better and will try this first.  Plan:  --switch from Repatha to Praluent 150mg  q14d  --continue Zetia 10mg  daily  --hold off on addition of statin for now given significant cramps, may reconsider at his follow-up.      2. CAD   S/p stent placed 06/2013 in LCx for MI. Recurrent chest pain since then with repeat caths, most recently 10/19 showing non-obstructive disease and a patent stent. He is closely managed by cardiology here at [facility].  --Continue management per primary cardiology team    Follow up in about 2 months, after 3-4 doses of Praluent     History of Present Illness:  [name] is a 49 y.o. 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    [name] has an extensive history of failed statin trials due to intolerance and side effects. He reports that he does fine with the statin for several weeks before developing debilitating muscle cramps and weakness, particularly in his legs. He has cramps at baseline and states the statins make it even worse. States that when they switch statins in the past he had some relief from the side effects before they return. States he could tolerate brand-name Lipitor, however when he was switched to the generic for insurance purposes he no longer tolerated it.     Subsequently, he was switched to Repatha by his primary cardiology team in attempt to lower his LDL to goal range. Unfortunately, he has also developed significant side effects to this therapy as well. He states that after each injection he feels like he has flu-like symptoms, endorsing low-grade fevers, chills, fatigue, sore throat. Denies any rashes at the injection site or elsewhere. These symptoms have dramatically interfered with his ability to perform his work-related duties which are physically demanding. His employer has even tried to send him home on the days immediately following his injections because he was concerned he had Covid-19 due to the side effects form the injection. Because of this, he has reduced the frequency of his injections to once per month instead of every 14 days. This has not improved his side effects, but it makes him have to deal with them less frequently. He has been able to tolerate Zetia 10 mg  daily without any issues and has been taking this as prescribed. He states he would be amenable to trying a different PCSK-9 medicine, but would like to avoid statins if at all possible.    Additionally, he endorses chest pain with exertion. The pain lasts for about 30 seconds and improves quickly with rest. He states this type of pain has been his baseline for over 1 year now and he is closely followed by his primary cardiology team. He denies new SOB, palpitations, orthopnea, LE swelling, weight gain.         Past Medical History:   Diagnosis Date   ??? Coronary artery disease     MI July 23, 2013   ??? Diabetes mellitus (CMS-HCC)    ??? Disease of thyroid gland    ??? Erectile dysfunction associated with type 2 diabetes mellitus (CMS-HCC)    ??? Gastroesophageal reflux    ??? Hyperlipidemia    ??? Hypertension    ??? Hypogonadism male    ??? Hypothyroidism    ??? Obesity (BMI 30-39.9)    ??? Sarcoidosis of lung (CMS-HCC)        Past Surgical History:  Past Surgical History:   Procedure Laterality Date   ??? ABDOMINAL SURGERY     ??? BACK SURGERY N/A    ??? CHOLECYSTECTOMY     ??? CORONARY ANGIOPLASTY WITH STENT PLACEMENT  July 23, 2013   ??? PR CATH PLACE/CORON ANGIO, IMG SUPER/INTERP,W LEFT HEART VENTRICULOGRAPHY N/A 01/17/2015    Procedure: Left Heart Catheterization;  Surgeon: Marlaine Hind, MD;  Location: [facility] CATH;  Service: Cardiology   ??? PR CATH PLACE/CORON ANGIO, IMG SUPER/INTERP,W LEFT HEART VENTRICULOGRAPHY N/A 02/12/2018    Procedure: Left Heart Catheterization;  Surgeon: Rosana Hoes, MD;  Location: [facility] CATH;  Service: Cardiology   ??? PR EXC SKIN BENIG <0.5 CM REMAINDER BODY N/A 05/25/2017    Procedure: EXCISION, BENIGN LESION INCLUDING MARGINS, GENITALIA; EXCISED DIAMETER 0.5 CM OR LESS;  Surgeon: Lazarus Gowda, MD;  Location: [facility] OR [facility];  Service: Urology   ??? PR IMPLANT SPINAL NEUROSTIM/RECEIVER Midline 04/15/2013    Procedure: INSERTION OR REPLACEMENT SPINAL NEUROSTIMULATOR PULSE GENERATOR OR RECEIVER, DIRECT OR INDUCTIVE COUPLING;  Surgeon: Marygrace Drought, MD;  Location: MAIN OR [facility];  Service: Anesthesiology   ??? PR INSERT,INFLATABLE PENILE PROSTHESIS N/A 05/06/2019    Procedure: R14 Insertion of inflatable penile prosthesis;  Surgeon: Turner Daniels, MD;  Location: [facility] OR [facility];  Service: Urology   ??? PR PERCUT IMPLNT NEUROELECT,EPIDURAL Midline 04/15/2013    Procedure: PERCUTANEOUS IMPLANTATION OF NEUROSTIMULATOR ELECTRODE ARRAY; EPIDURAL;  Surgeon: Marygrace Drought, MD;  Location: MAIN OR [facility]; Service: Anesthesiology   ??? PR RECONSTRUC ANT MALE URETHRA N/A 12/01/2017    Procedure: URETHROPLASTY, ONE-STAGE RECONSTRUCTION OF MALE ANTERIOR URETHRA;  Surgeon: Turner Daniels, MD;  Location: [facility] OR [facility];  Service: Urology   ??? SPINAL FUSION     ??? SPINE SURGERY     ??? TONSILLECTOMY N/A        Medications:  Current Outpatient Medications   Medication Sig Dispense Refill   ??? ascorbic acid (VITAMIN C) 1000 MG tablet Take 1,000 mg by mouth Two (2) times a day.      ??? aspirin (ECOTRIN) 81 MG tablet Take 81 mg by mouth daily.     ??? carvediloL (COREG) 12.5 MG tablet Take 1 tablet (12.5 mg total) by mouth Two (2) times a day. 180 tablet 1   ??? empty container Misc Use  as directed to dispose of injectable medications 1 each 3   ??? evolocumab 140 mg/mL PnIj Inject the contents of 1 pen (140 mg) under the skin every fourteen (14) days. 6 mL 3   ??? ezetimibe (ZETIA) 10 mg tablet Take 1 tablet (10 mg total) by mouth daily. 30 tablet 11   ??? levothyroxine (SYNTHROID) 112 MCG tablet Take 1 tablet (112 mcg total) by mouth daily. 60 tablet 5   ??? lisinopriL (PRINIVIL,ZESTRIL) 10 MG tablet Take 1 tablet (10 mg total) by mouth two (2) times a day. 180 tablet 3   ??? omeprazole (PRILOSEC) 20 MG capsule Take 1 capsule (20 mg total) by mouth daily as needed. 90 capsule 3   ??? syringe with needle (TERUMO SYRINGE) 3 mL 23 gauge x 1 1/2 Syrg 1 Syringe by Miscellaneous route once a week. 25 Syringe 11   ??? tadalafiL (CIALIS) 5 MG tablet Take 1 tablet (5 mg total) by mouth daily. 90 tablet 3   ??? testosterone cypionate (DEPOTESTOTERONE CYPIONATE) 200 mg/mL injection Inject 0.5 mL (100 mg total) into the muscle once a week. 10 mL 1     No current facility-administered medications for this visit.       Allergies:  Allergies   Allergen Reactions   ??? Codeine Nausea And Vomiting   ??? Dilaudid [Hydromorphone] Nausea And Vomiting       Social History:  Social History     Socioeconomic History   ??? Marital status: Married     Spouse name: Not on file   ??? Number of children: Not on file   ??? Years of education: Not on file   ??? Highest education level: Not on file   Occupational History   ??? Occupation: custodian      Employer: [employer]   Tobacco Use   ??? Smoking status: Former Smoker     Years: 0.50     Types: Cigarettes     Quit date: 05/05/1992     Years since quitting: 27.4   ??? Smokeless tobacco: Never Used   Vaping Use   ??? Vaping Use: Never used   Substance and Sexual Activity   ??? Alcohol use: No     Alcohol/week: 0.0 standard drinks   ??? Drug use: No   ??? Sexual activity: Not Currently     Partners: Female   Other Topics Concern   ??? Exercise No   ??? Living Situation No     Comment: wife/ son   Social History Narrative    Lives in [place], [place] with his wife (Amy) who works as a Public house manager to be a Engineer, civil (consulting), and son Feliz Beam, born 59). Reports good relationships at home. 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 Enjoys this work although has to work 12-14 hour days. Heavy work is troubling for him although currently managing as best he can.    Nutrition: Tries to watch his intake. Avoids soda. More water intake. Minimal fried food and fast food.    Exercise: Enjoys walking/fishing although has less time now days as he wakes up at 3:30am and gets home after dark.     Social Determinants of Health     Financial Resource Strain: Low Risk    ??? Difficulty of Paying Living Expenses: Not hard at all   Food Insecurity: Unknown   ??? Worried About Running Out of Food in the Last Year: Patient refused   ???  Ran Out of Food in the Last Year: Patient refused   Transportation Needs: Unknown   ??? Lack of Transportation (Medical): Patient refused   ??? Lack of Transportation (Non-Medical): Patient refused   Physical Activity:    ??? Days of Exercise per Week:    ??? Minutes of Exercise per Session:    Stress:    ??? Feeling of Stress :    Social Connections:    ??? Frequency of Communication with Friends and Family:    ??? Frequency of Social Gatherings with Friends and Family:    ??? Attends Religious Services:    ??? Database administrator or Organizations:    ??? Attends Banker Meetings:    ??? Marital Status:        Family History:  Family History   Problem Relation Age of Onset   ??? Stroke Mother    ??? Heart disease Father    ??? Anesthesia problems Neg Hx    ??? Bleeding Disorder Neg Hx        Review of Systems:  A 12-system review of systems was performed and was negative except as noted in the HPI.    Physical Exam:  VITAL SIGNS:   Vitals:    09/23/19 1318   BP: 149/80   BP Site: L Arm   BP Position: Sitting   BP Cuff Size: Large   Pulse: 76   SpO2: 96%   Weight: 94.3 kg (208 lb)   Height: 165.1 cm (5' 5)     Body mass index is 34.61 kg/m??.  GENERAL: well-developed, well-nourished middle aged man sitting comfortably in exam room  HEENT: EOMI, MMM, sclera anicteric  NECK: Supple without LAD  RESPIRATORY: CTA bilaterally without wheezes or crackles.  Normal WOB.  CARDIOVASCULAR: RRR without m/r/g.  ABDOMEN: Soft, NT/ND   EXTREMITIES:  No LE edema.  2+ radial, PT, and DP pulses bilaterally    Pertinent Test Results:    Cardiographics  Echocardiogram (02/04/18):  ?? Technically difficult study due to chest wall/lung interference  ?? Normal left ventricular systolic function, ejection fraction > 55%  ?? Normal right ventricular systolic function  ?? No significant valvular abnormalities    Cath (01/15/18):  Findings:  1. Non-flow limiting coronary artery disease.      Lab Review  Recent Labs   Lab Units 09/23/19  1318 09/23/19  1314   TRIGLYCERIDE, OUTREACH mg/dL 191*  --    TRIGLYCERIDES mg/dL  --  478*     Recent Labs   Lab Units 09/23/19  1318 09/23/19  1314   CHOLESTEROL, POC mg/dL 295*  --    CHOLESTEROL mg/dL  --  621*       Heloise Ochoa, MS4

## 2019-09-23 NOTE — Unmapped (Addendum)
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  Please begin praluent every 2 weeks.   Continue zeita.   Marland Kitchen

## 2019-09-25 LAB — LIPOPROTEIN A: Lipoprotein (little a):MCnc:Pt:Ser/Plas:Qn:: 12

## 2019-09-27 DIAGNOSIS — R7989 Other specified abnormal findings of blood chemistry: Principal | ICD-10-CM

## 2019-09-27 MED ORDER — TESTOSTERONE CYPIONATE 200 MG/ML INTRAMUSCULAR OIL
0 refills | 0 days | Status: CP
Start: 2019-09-27 — End: ?

## 2019-09-27 NOTE — Unmapped (Signed)
Can we do a video visit with labs prior so I can continue to refill his testosterone?     Verlon Au

## 2019-09-29 NOTE — Unmapped (Signed)
[facility] Specialty Medication Onboarding    Specialty Medication: PRALUENT PENS 150MG /ML  Prior Authorization: Approved   Financial Assistance: No - copay  <$25  Final Copay/Day Supply: $0 / 28 DAYS    Insurance Restrictions: Yes - max 1 month supply     Notes to Pharmacist: MEDICATION CHANGE    The triage team has completed the benefits investigation and has determined that the patient is able to fill this medication at [facility]. Please contact the patient to complete the onboarding or follow up with the prescribing physician as needed.

## 2019-09-30 NOTE — Unmapped (Signed)
***Incomplete onboarding - waiting for patient to call back. Switching from Repatha to Praluent due to side effects***    [facility]   Patient Onboarding/Medication Counseling    Mr.Polmanteer is a 49 y.o. male with hyperlipidemia who I am counseling today on initiation of therapy.  I am speaking to {Blank:19197::the patient,the patient's caregiver, ***,the patient's family member, ***,***}. He is switching from Repatha to Praluent due to side effects on Repatha.    Was a Nurse, learning disability used for this call? No    Verified patient's date of birth / HIPAA.    Specialty medication(s) to be sent: General Specialty: Praluent      Non-specialty medications/supplies to be sent: n/a      Medications not needed at this time: n/a         Praluent (alirocumab)    Medication & Administration     Dosage: Inject the contents of 1 pen (150mg ) under the skin every 2 weeks.    Administration: Inject under the skin of the thigh, abdomen or upper arm. Rotate sites with each injection.  ??? Injection instructions - Pen:  o Take 1 (or 2) pens out of the refrigerator and allow to stand at room temperature for 30 to 40 minutes  o Check the pen(s) for the following:  - Expiration date  - Absence of damage or cracks  - Medication is clear, colorless or pale yellow and free from particles  o Choose your injection site and clean with alcohol wipe. Allow to air dry completely.  o Pull the blue needle cap straight off and discard  o Hold the pen in your palm with your thumb over the green button, but do not touch the green button yet.  o Press the pen unto your skin at a 90 degree angle until the yellow safety cover disappears  o Push the green button and immediately release. You will hear a ???click.??? This signifies that the injection has started.  o Continue to hold the pen in place, maintaining pressure, until the entire window has turned yellow.  - This may take up to 20 seconds.  - You may hear a second ???click??? when the injection is complete.  o Lift the pen straight off your skin and dispose of it in a sharps container  o If there is blood at the injection site gently press a cotton ball or guaze to the site. Do not rub the injection site.    Adherence/Missed dose instructions: Administer a missed dose within 7 days and resume your normal schedule. If it has been more than 7 days and you inject every 2 weeks, skip the missed dose and resume your normal schedule..     Goals of Therapy     Lower cholesterol  Lower the risk of heart attack, stroke and unstable angina in people with heart disease    Side Effects & Monitoring Parameters   ??? Injection site irritation  ??? Flu-like symptoms  ??? Nose or throat irritation    The following side effects should be reported to the provider:  ??? Signs of an allergic reaction    Contraindications, Warnings, & Precautions     ??? Hypersensitivity    Drug/Food Interactions     ??? Medication list reviewed in Epic. The patient was instructed to inform the care team before taking any new medications or supplements. No drug interactions identified.     Storage, Handling Precautions, & Disposal     ??? Praluent should be  stored in the refrigerator.   o If necessary Praluent may be stored at room temperature, in the original carton, for no more than 30 days.  ??? Place used devices into a sharps container for disposal      Current Medications (including OTC/herbals), Comorbidities and Allergies     Current Outpatient Medications   Medication Sig Dispense Refill   ??? alirocumab (PRALUENT) 150 mg/mL subcutaneous injection Inject the contents of 1 pen (150 mg total) under the skin every fourteen (14) days. 6 mL 4   ??? ascorbic acid (VITAMIN C) 1000 MG tablet Take 1,000 mg by mouth Two (2) times a day.      ??? aspirin (ECOTRIN) 81 MG tablet Take 81 mg by mouth daily.     ??? carvediloL (COREG) 12.5 MG tablet Take 1 tablet (12.5 mg total) by mouth Two (2) times a day. 180 tablet 1   ??? empty container Misc Use as directed to dispose of injectable medications 1 each 3   ??? ezetimibe (ZETIA) 10 mg tablet Take 1 tablet (10 mg total) by mouth daily. 30 tablet 11   ??? levothyroxine (SYNTHROID) 112 MCG tablet Take 1 tablet (112 mcg total) by mouth daily. 60 tablet 5   ??? lisinopriL (PRINIVIL,ZESTRIL) 10 MG tablet Take 1 tablet (10 mg total) by mouth two (2) times a day. 180 tablet 3   ??? omeprazole (PRILOSEC) 20 MG capsule Take 1 capsule (20 mg total) by mouth daily as needed. 90 capsule 3   ??? syringe with needle (TERUMO SYRINGE) 3 mL 23 gauge x 1 1/2 Syrg 1 Syringe by Miscellaneous route once a week. 25 Syringe 11   ??? tadalafiL (CIALIS) 5 MG tablet Take 1 tablet (5 mg total) by mouth daily. 90 tablet 3   ??? testosterone cypionate (DEPOTESTOTERONE CYPIONATE) 200 mg/mL injection INJECT 1/2 (ONE-HALF) ML INTRAMUSCULARLY  ONCE A WEEK 2 mL 0     No current facility-administered medications for this visit.       Allergies   Allergen Reactions   ??? Codeine Nausea And Vomiting   ??? Dilaudid [Hydromorphone] Nausea And Vomiting       Patient Active Problem List   Diagnosis   ??? Cervical pain (neck)   ??? Lumbar radiculitis   ??? Type 2 Diabetes in Remission   ??? Essential hypertension   ??? Hypothyroidism (acquired)   ??? Esophageal reflux   ??? Essential tremor   ??? Ganglion   ??? Onychia and paronychia of toe   ??? Primary localized osteoarthrosis, hand   ??? Hypogonadism male   ??? Sarcoidosis   ??? Postlaminectomy syndrome   ??? Urolithiasis   ??? Chronic back pain   ??? Lumbar post-laminectomy syndrome   ??? Spinal cord stimulator status   ??? Fibrous non-union   ??? Palpitations   ??? History of coronary artery disease   ??? Acute frontal sinusitis   ??? Erectile dysfunction   ??? Lower urinary tract symptoms (LUTS)   ??? Other headache syndrome   ??? OSA (obstructive sleep apnea)   ??? Other hyperlipidemia   ??? Needs flu shot   ??? Low testosterone in male   ??? Urethrocutaneous fistula   ??? Acute otitis externa of right ear   ??? Angina pectoris (CMS-HCC)   ??? Hyperlipidemia   ??? Class 2 severe obesity due to excess calories with serious comorbidity in adult (CMS-HCC)   ??? Gastroesophageal reflux disease with esophagitis   ??? Prediabetes       Reviewed and up to  date in Epic.    Appropriateness of Therapy     Is medication and dose appropriate based on diagnosis? Yes    Prescription has been clinically reviewed: Yes    Baseline Quality of Life Assessment      How many days over the past month did your high cholesterol  keep you from your normal activities? For example, brushing your teeth or getting up in the morning. {Blank:19197::0,***,Patient declined to answer}    Financial Information     Medication Assistance provided: Prior Authorization    Anticipated copay of $0 reviewed with patient. Verified delivery address.    Delivery Information     Scheduled delivery date: ***    Expected start date: ***    Medication will be delivered via {Blank:19197::UPS,Next Day Courier,Same Day Courier,Clinic Courier - *** clinic,***} to the {Blank:19197::prescription,temporary} address in Epic WAM.  This shipment {Blank single:19197::will,will not} require a signature.      Explained the services we provide at [facility] and that each month we would call to set up refills.  Stressed importance of returning phone calls so that we could ensure they receive their medications in time each month.  Informed patient that we should be setting up refills 7-10 days prior to when they will run out of medication.  A pharmacist will reach out to perform a clinical assessment periodically.  Informed patient that a welcome packet and a drug information handout will be sent.      Patient verbalized understanding of the above information as well as how to contact the pharmacy at [phone] option 4 with any questions/concerns.  The pharmacy is open Monday through Friday 8:30am-4:30pm.  A pharmacist is available 24/7 via pager to answer any clinical questions they may have.    Patient Specific Needs     - Does the patient have any physical, cognitive, or cultural barriers? {Blank single:19197::No,Yes - ***}    - Patient prefers to have medications discussed with  {Blank single:19197::Patient,Family Member,Caregiver,Other}     - Is the patient or caregiver able to read and understand education materials at a high school level or above? {Blank single:19197::No,Yes}    - Patient's primary language is  {Blank single:19197::English,Spanish,***}     - Is the patient high risk? {Blank single:19197::No,Yes, pediatric patient. Contraindications and appropriate dosing have been assessed.,Yes, pregnant/nursing patient. Contraindications have been assessed.,Yes, patient is taking oral chemotherapy. Appropriateness of therapy has been assessed.,Yes, patient is taking a REMS drug. Medication is dispensed in compliance with REMS program.}     - Does the patient require a Care Management Plan? {Blank single:19197::No,Yes}     - Does the patient require physician intervention or other additional services (i.e. nutrition, smoking cessation, social work)? {Blank single:19197::No,Yes - ***}      Clydell Hakim  [facility] Specialty Pharmacist

## 2019-10-03 ENCOUNTER — Encounter: Admit: 2019-10-03 | Discharge: 2019-10-04 | Payer: BLUE CROSS/BLUE SHIELD

## 2019-10-03 LAB — HEMATOCRIT: Hematocrit:VFr:Pt:Bld:Qn:: 41.6

## 2019-10-03 LAB — TESTOSTERONE TOTAL: Testosterone:MCnc:Pt:Ser/Plas:Qn:: 239

## 2019-10-03 LAB — PROSTATE SPECIFIC ANTIGEN: Prostate specific Ag:MCnc:Pt:Ser/Plas:Qn:: 0.46

## 2019-10-03 NOTE — Unmapped (Signed)
The patient was a no show for their New patient evaluation at the [facility].  Called same day to cancel as he was called into work.

## 2019-10-04 ENCOUNTER — Telehealth: Admit: 2019-10-04 | Discharge: 2019-10-05 | Payer: BLUE CROSS/BLUE SHIELD

## 2019-10-04 DIAGNOSIS — N400 Enlarged prostate without lower urinary tract symptoms: Principal | ICD-10-CM

## 2019-10-04 DIAGNOSIS — N529 Male erectile dysfunction, unspecified: Principal | ICD-10-CM

## 2019-10-04 DIAGNOSIS — R7989 Other specified abnormal findings of blood chemistry: Principal | ICD-10-CM

## 2019-10-04 MED ORDER — TADALAFIL 5 MG TABLET
ORAL_TABLET | Freq: Every day | ORAL | 3 refills | 90.00000 days | Status: CP
Start: 2019-10-04 — End: 2020-09-28

## 2019-10-04 MED ORDER — SYRINGE WITH NEEDLE 3 ML 23 GAUGE X 1 1/2"
1 refills | 0.00000 days | Status: CP
Start: 2019-10-04 — End: ?

## 2019-10-04 MED ORDER — TESTOSTERONE CYPIONATE 200 MG/ML INTRAMUSCULAR OIL
INTRAMUSCULAR | 5 refills | 42 days | Status: CP
Start: 2019-10-04 — End: ?

## 2019-10-04 NOTE — Unmapped (Signed)
I spent 15 minutes on the real-time audio and video with the patient on the date of service. I spent an additional 10 minutes on pre- and post-visit activities on the date of service.     The patient was physically located in [place] or a state in which I am permitted to provide care. The patient and/or parent/guardian understood that s/he may incur co-pays and cost sharing, and agreed to the telemedicine visit. The visit was reasonable and appropriate under the circumstances given the patient's presentation at the time.    The patient and/or parent/guardian has been advised of the potential risks and limitations of this mode of treatment (including, but not limited to, the absence of in-person examination) and has agreed to be treated using telemedicine. The patient's/patient's family's questions regarding telemedicine have been answered.     If the visit was completed in an ambulatory setting, the patient and/or parent/guardian has also been advised to contact their provider???s office for worsening conditions, and seek emergency medical treatment and/or call 911 if the patient deems either necessary.         Tony Perkins  10/04/2019, 9:07 AM    Urology Clinic Note    Assessment:  49 y.o. 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 Prior erythrocytosis, although normalized 02/2018.     Plan:  -Check total testosterone, HCT, and PSA at upcoming visit with Dr. Guy Perkins   -Increase TC to 120 mg dosing.   -Continue daily tadalafil  For LUTS  - Would like to follow up with Dr. Guy Perkins regarding IPP. Some concern regarding tight pump  - Mild LUTs, but will continue timed and double voiding.     Follow-up in 6 months for labs and refills    Counseling:    We discussed that options include observation with implementation of dietary/exercise strategies versus testosterone replacement (with gel, injections, pellets, etc) and alternative therapies such as Clomid and/or Arimidex to increase natural production. We also discussed that losing weight is a means of naturally improving testosterone levels. According to findings presented at the annual meeting of the Endocrine Society in 2012, obese men who lost an average of 17 pounds saw their testosterone levels increase by 15 percent. Some studies suggest dietary modifications including drinking less alcohol, and eating a well-balanced diet with lots of vegetables can also help.  Also stress management and improved sleep habits were discussed. I suggested a trial of 6 months of diet/exercise strategies prior to medical therapy.    We reviewed risks of TRT including but not limited to acne, fluid retention, BPH or urinary symptoms, breast enlargement, worsening of sleep apnea, infertility, changes in blood count requiring phlebotomy, changes in cholesterol requiring additional medical therapy, blood clots including deep venous thrombosis or thromboembolic events, and prostate cancer progression.  We also discussed recent literature suggesting an increase in cardiovascular events (including heart attack/stroke) and mortality, but we also reviewed that there is conflicting literature in this regard.  He understands these risks and wishes to proceed.?? We also reviewed the AUA's Position Statement on testosterone therapy with him and the new FDA warning, and these statements were provided in writing.    We discussed that he will need to be followed at regular intervals for physical exam (including DRE), reassessment of symptoms, and lab work including PSA,  Hormone levels, and CBC.  After being fully advised of these risks and expressing understanding, he  would like to pursue therapy.  He was given a handout delineating the importance of regular follow-up and risks of testosterone replacement therapy, including those noted above.         Referring Physician:   Parke Poisson, MD  [address]  [place],  [place] [address]    PCP:   Tony Bookbinder, MD    Patient: Tony Perkins  DOB: 1970-11-27   [place] ID#: [id number]     Chief complaint: follow-up ED and hypogonadism  HPI:    Tony Perkins returns today for office visit to follow up on ED and hypogonadism.    Hypogonadism: He continues testosterone cypionate  100mg  weekly, last injected Saturday. Endorses poor libido and energy. Last checked, testosterone was low.     ED: Now s/p IPP 04/2019. Appreciates that he's lost some length with the prosthesis, and he notes he cannot pump more than 3 pumps. Has an upcoming appointment with Dr. Guy Perkins for further discussion.     NO new CV problems.     LUTs: Has history of prior fistula repair with Dr. Guy Perkins.Sometimes has post void dribbling. Feels sometimes like his bladder doesn't empty out completely and he has a weak stream/ intermittency. Wouldn't want to start medications at this point. Taking pygnum for prostate health, has saw palmetto which he appreciates is helpful.  Still taking daily cialis.     Caffeine: 12 ounce diet mountain dew/ once day.     BM: Daily BM.       Prior:   PVR 46 mls  UA trace blood, 1+ glucose         09/02/2018:  He continues testosterone cypionate  80mg  weekly, last injected Saturday. Endorses good libido and energy.     Now currently on tadalafil 5 mg for erections. Notes he can get an erection with tadalafil 5 mg, but it doesn't last long and he has troubles with penetration. He is a married and monogamous; married for 25 years. Has trimix 0.2 mls that he uses for back up very effectively. He has never tried an additional 5 mg of tadalafil prn intercourse. Only has IC 2 x per month. Notes penis is retracted.     NO new CV problems, but did recently break his foot and will have to have surgery later this week.     Has history of prior fistula repair with Dr. Guy Perkins; denies LUTs today.     Results for Tony Perkins (MRN [id number]) as of 03/14/2019 09:47 Ref. Range 02/21/2019 17:42   Prostate Specific Antigen Latest Ref Range: 0.00 - 4.00 ng/mL 0.38   Results for EDUAR, KUMPF (MRN [id number]) as of 03/14/2019 09:47   Ref. Range 02/21/2019 17:42   Testosterone ([facility]) Latest Ref Range: 240 - 950 ng/dL 841 (L)     Results for ASSER, LUCENA (MRN [id number]) as of 03/14/2019 09:47   Ref. Range 02/21/2019 17:42   HCT Latest Ref Range: 41.0 - 53.0 % 48.5     02/2018 visit:   Since he was last seen in clinic, Tony Perkins underwent successful UC fistula repair 12/01/2017 by Dr. Guy Perkins. He is pleased with the result.    In terms of erectile function, he continues to use Trimix 2x per month.  He also has a prescription for sildenafil. He takes 60 mg prior to intercourse. This provides an erection suitable for intercourse 1/3 of the time.  His urinary function has been largely stable. He notes a weakened stream during the  day, nocturia 1x per night, and sensation of incomplete emptying. His post-void residual 03/08/2018 was 51cc.  He was recommended for an alpha blocker at the last visit with [facility] but would prefer to avoid retrograde ejaculation.  He is interested in daily dose Cialis for dual indication of ED and LUTs.    Last testosterone was 204 ng/dL 07/6960. He continues with testosterone cypionate 0.1mL (100mg ) weekly.  His HCT was 55.5% last month.    Component      Latest Ref Rng & Units 02/12/2018   WBC      4.5 - 11.0 10*9/L 6.3   RBC      4.50 - 5.90 10*12/L 5.90   HGB      13.5 - 17.5 g/dL 95.2 (H)   HCT      84.1 - 53.0 % 55.5 (H)   MCV      80.0 - 100.0 fL 94.1   MCH      26.0 - 34.0 pg 30.2   MCHC      31.0 - 37.0 g/dL 32.4   RDW      40.1 - 15.0 % 15.8 (H)   MPV      7.0 - 10.0 fL 7.0   Platelet      150 - 440 10*9/L 315     Component      Latest Ref Rng & Units 06/23/2013 09/22/2013 01/05/2014 03/18/2016   Testosterone      179 - 756 ng/dL 027 253 664 403     Component      Latest Ref Rng & Units 01/13/2017 05/05/2017 09/15/2017   Testosterone      179 - 756 ng/dL 474 2,595 (H) 638           09/15/2017:  Tony Perkins is a very nice 49 y.o. male with multiple medical comorbidities including DM, CAD s/p PCI, HTN, obesity who is being seen in follow-up.  Initially seen at the request of Dr. Pascal Lux for evaluation of erectile dysfunction and low ejaculate.    Patient reports improved libido since starting testosterone injections. He is currently prescribed 0.5cc weekly. He does report dissatisfaction with premature ejaculation as well as difficulty maintaining erections longer than 15-20 minutes. He reports no difficulty maintaining the erection with the use of trimix, which he states he does use on occasion.      He's getting an ejaculation 2-3 times a week, with intercourse only rarely (every 2-3 months).  He doesn't use trimix with masturbation.    SHIM: 17  IPSS: 14/1    We again reviewed risks of TRT including but not limited to acne, fluid retention, BPH or urinary symptoms, breast enlargement, worsening of sleep apnea, infertility, changes in blood count requiring phlebotomy, changes in cholesterol requiring additional medical therapy, blood clots including deep venous thrombosis or thromboembolic events, and prostate cancer progression.  We also discussed recent literature suggesting an increase in cardiovascular events (including heart attack/stroke) and mortality, but we also reviewed that there is conflicting literature in this regard.  He understands these risks and wishes to proceed.?? We also reviewed the AUA's Position Statement on testosterone therapy with him and the new FDA warning, and these statements were provided in writing.    We discussed that he will need to be followed at regular intervals for physical exam (including DRE), reassessment of symptoms, and lab work including PSA,  Hormone levels, and CBC.  After being fully advised of these risks and expressing understanding, he would like  to pursue therapy.  He was given a handout delineating the importance of regular follow-up and risks of testosterone replacement therapy, including those noted above.     01/13/2017:  His ED started approximately 5 years ago and has been getting gradually worse. He denies any changes or new stresses in his life. He has previously tried Viagra and Levitra with what he describes as inconsistent results. He was taking doses of Viagra ranging from 25mg  - 100mg  over the period of a few months with mixed results. He was later prescribed a trial of Levitra, which he states gave him no benefit. He describes headaches and GI discomfort with these medications. He was subsequently prescribed Trimix, with good results.     Currently uses Trimix once in a while and generic sildenafil as needed other times. The generic viagra and trimix work well. He actually had to decrease dose of Trimix to 0.15 due to prolonged erections with higher doses. He gets excellent erections with these but denies any erections without assistance. Denies any nocturnal or morning erections. He denies curvature of his penis when it is erect.  He reports that his sex drive is different that when he was on testosterone previously.  Mr. Debono states that he has a low libido and energy level.  It is difficult for him to get aroused. Only sleeps 6 hours per night and previously had OSA which resolved with weight loss.    He is bothered by a small lump on the distal dorsal aspect of his penile shaft which has been there for a few years now and has not changed in size. It is not painful but he does notice it and is bothered by it.    Patient was previously seen by Dr. Ralene Cork in 12/2013 for many of the same issues described above. At that time he was being treated for hypogonadism where his TRT was held as he maintained normal levels after lifestyle changes and significant weight loss.  He also on Testopel for about 2 years by his report, prior to that was on monthly testosterone injections.  He was also being treated for erectile dysfunction and possible prostatitis. He had a cystoscopy at that time which showed no evidence of obstruction.    Patient also describes severe lower urinary tract symptoms, predominantly frequency, intermittency, and urgency. He describes an occasional decrease in the force and caliber of his stream but otherwise he feels that he is emptying his bladder well overall. He notes nocturia 1 time per night. He states his diabetes is well controlled with a recent HgbA1c of 5.2. He is not currently taking medication for glucose control. He is actually pretty satisfied with his urination at this time.    IPSS  Incomplete emptying: 4  Frequency: 4  Intermittency: 2  Urgency: 2  Weak stream: 2  Straining: 2  Nocturia: 1  Total: 18  QOL: 2      Outside records were reviewed.    History obtained from the patient himself.      Past Medical History:   Diagnosis Date   ??? Coronary artery disease     MI July 23, 2013   ??? Diabetes mellitus (CMS-HCC)    ??? Disease of thyroid gland    ??? Erectile dysfunction associated with type 2 diabetes mellitus (CMS-HCC)    ??? Gastroesophageal reflux    ??? Hyperlipidemia    ??? Hypertension    ??? Hypogonadism male    ??? Hypothyroidism    ??? Obesity (  BMI 30-39.9)    ??? Sarcoidosis of lung (CMS-HCC)         Past Surgical History:   Procedure Laterality Date   ??? ABDOMINAL SURGERY     ??? BACK SURGERY N/A    ??? CHOLECYSTECTOMY     ??? CORONARY ANGIOPLASTY WITH STENT PLACEMENT  July 23, 2013   ??? PR CATH PLACE/CORON ANGIO, IMG SUPER/INTERP,W LEFT HEART VENTRICULOGRAPHY N/A 01/17/2015    Procedure: Left Heart Catheterization;  Surgeon: Marlaine Hind, MD;  Location: [facility] CATH;  Service: Cardiology   ??? PR CATH PLACE/CORON ANGIO, IMG SUPER/INTERP,W LEFT HEART VENTRICULOGRAPHY N/A 02/12/2018    Procedure: Left Heart Catheterization;  Surgeon: Rosana Hoes, MD;  Location: [facility] CATH;  Service: Cardiology   ??? PR EXC SKIN BENIG <0.5 CM REMAINDER BODY N/A 05/25/2017    Procedure: EXCISION, BENIGN LESION INCLUDING MARGINS, GENITALIA; EXCISED DIAMETER 0.5 CM OR LESS;  Surgeon: Lazarus Gowda, MD;  Location: [facility] OR [facility];  Service: Urology   ??? PR IMPLANT SPINAL NEUROSTIM/RECEIVER Midline 04/15/2013    Procedure: INSERTION OR REPLACEMENT SPINAL NEUROSTIMULATOR PULSE GENERATOR OR RECEIVER, DIRECT OR INDUCTIVE COUPLING;  Surgeon: Marygrace Drought, MD;  Location: MAIN OR [facility];  Service: Anesthesiology   ??? PR INSERT,INFLATABLE PENILE PROSTHESIS N/A 05/06/2019    Procedure: R14 Insertion of inflatable penile prosthesis;  Surgeon: Turner Daniels, MD;  Location: [facility] OR [facility];  Service: Urology   ??? PR PERCUT IMPLNT NEUROELECT,EPIDURAL Midline 04/15/2013    Procedure: PERCUTANEOUS IMPLANTATION OF NEUROSTIMULATOR ELECTRODE ARRAY; EPIDURAL;  Surgeon: Marygrace Drought, MD;  Location: MAIN OR [facility];  Service: Anesthesiology   ??? PR RECONSTRUC ANT MALE URETHRA N/A 12/01/2017    Procedure: URETHROPLASTY, ONE-STAGE RECONSTRUCTION OF MALE ANTERIOR URETHRA;  Surgeon: Turner Daniels, MD;  [facility];  Service: Urology   ??? SPINAL FUSION     ??? SPINE SURGERY     ??? TONSILLECTOMY N/A        Allergies:  Codeine and Dilaudid [hydromorphone]    Medications:  The patient has a current medication list which includes the following prescription(s): alirocumab, ascorbic acid (vitamin c), aspirin, carvedilol, empty container, ezetimibe, levothyroxine, lisinopril, omeprazole, syringe with needle, tadalafil, and testosterone cypionate.    Social History:  Social History     Socioeconomic History   ??? Marital status: Married     Spouse name: Not on file   ??? Number of children: Not on file   ??? Years of education: Not on file   ??? Highest education level: Not on file   Occupational History   ??? Occupation: custodian      Employer: [employer]   Tobacco Use   ??? Smoking status: Former Smoker     Years: 0.50     Types: Cigarettes     Quit date: 05/05/1992     Years since quitting: 27.4   ??? Smokeless tobacco: Never Used   Vaping Use   ??? Vaping Use: Never used   Substance and Sexual Activity   ??? Alcohol use: No     Alcohol/week: 0.0 standard drinks   ??? Drug use: No   ??? Sexual activity: Not Currently     Partners: Female   Other Topics Concern   ??? Exercise No   ??? Living Situation No     Comment: wife/ son   Social History Narrative    Lives in [place], [place] with his wife (Amy) who works as a Lawyer and studying to be a  nurse, and son Feliz Beam, born 33). Reports good relationships at home. 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 Enjoys this work although has to work 12-14 hour days. Heavy work is troubling for him although currently managing as best he can.    Nutrition: Tries to watch his intake. Avoids soda. More water intake. Minimal fried food and fast food.    Exercise: Enjoys walking/fishing although has less time now days as he wakes up at 3:30am and gets home after dark.     Social Determinants of Health     Financial Resource Strain: Low Risk    ??? Difficulty of Paying Living Expenses: Not hard at all   Food Insecurity: Unknown   ??? Worried About Running Out of Food in the Last Year: Patient refused   ??? Ran Out of Food in the Last Year: Patient refused   Transportation Needs: Unknown   ??? Lack of Transportation (Medical): Patient refused   ??? Lack of Transportation (Non-Medical): Patient refused   Physical Activity:    ??? Days of Exercise per Week:    ??? Minutes of Exercise per Session:    Stress:    ??? Feeling of Stress :    Social Connections:    ??? Frequency of Communication with Friends and Family:    ??? Frequency of Social Gatherings with Friends and Family:    ??? Attends Religious Services:    ??? Database administrator or Organizations:    ??? Attends Banker Meetings:    ??? Marital Status:        Family History:  The patient's family history includes Heart disease in his father; Stroke in his mother.    Review of Systems:   General: Denies recent weight changes, fever, constitutional symptoms.  Skin: Denies rashes, eruptions, dryness, jaundice, changes in skin, hair, or nails, discoloration of skin.  Eyes: Denies blurred vision, double vision, pain  Ears, nose, and throat: Denies soreness and/or redness of gums, hoarseness, difficulty in swallowing, obstruction, sinus pain, earaches.  Musculoskeletal: Denies joint pain, neck pain, back pain  Respiratory: Denies chest pain, wheezing, cough, difficulty breathing, asthma, bronchitis, pneumonia, tuberculosis, shortness of breath, emphysema  Neurologic: Denies fainting, blackouts, seizures, paralysis, tingling, tremors, memory loss, dizzy spells, stroke  Cardiovascular: Denies chest pain, rapid heart beat, swelling, dizziness, faintness, varicose veins, heart valve problems.  Endocrine: Denies thyroid dysfunction, fatigue, heat and cold intolerance, excessive sweating, thirst, hunger  Gastrointestinal: Denies nausea, vomiting, diarrhea, constipation, indigestion, food intolerance, hemorrhoids, jaundice, heartburn, hepatitis  Genitourinary: As per HPI  Hematologic: Denies anemia, easy bruising or bleeding, past transfusion, swollen glands, blood clotting problems  Psychologic: Denies nervousness, mood swings, insomnia, headaches, depression.    Physical Exam:  There were no vitals taken for this visit.      GENERAL: The patient is a pleasant, male in no acute distress.     GENITOURINARY:02/2019 Circumcised penis with bilaterally descended testicles. No testicular masses or tenderness.  Digital rectal exam reveals a 30 g enlarged prostate that is firm, smooth and symmetric without nodules or tenderness.  SKIN: No signs of cyanosis or clubbing.

## 2019-10-11 MED FILL — PRALUENT PEN 150 MG/ML SUBCUTANEOUS PEN INJECTOR: 28 days supply | Qty: 2 | Fill #0 | Status: AC

## 2019-10-20 ENCOUNTER — Encounter: Admit: 2019-10-20 | Discharge: 2019-10-21 | Payer: BLUE CROSS/BLUE SHIELD | Attending: Urology | Primary: Urology

## 2019-10-20 NOTE — Unmapped (Signed)
Assessment:  s/p IPP 05/06/19  Right cylinder herniation    Long discussion re: management options.  Recommend IPP revision in which we will attempt to place the cylinder into the right corpora. Will almost certainly not need to change out device. Will be difficult. Discussed risks, benefits and alternatives extensively, and he wishes to move forward.    Plan:  IPP revision 7/7    Surgery Scheduling Checklist    Surgery (to be used for verify informed consent): Revision of inflatable penile prosthesis  Date of surgery: Next available. Requested 7/7.  Case request ordered: Yes    COVID vaccination status: Unknown. Will need proof of vaccine or COVID PCR testing 2-3 days before surgery.    Pre-op: No  Pre-care: No  Blood thinners: ASA (81) - do not stop  Labwork: None  Admission status: Outpatient  Post-op appt: TBD  Other pre-op needs: None  Case length: IPP (180 with turnover)  Priority level: Semi-elective    We discussed the additional risks related to undergoing a procedure during the COVID19 pandemic, including:      - The lack of information on the true risks during this pandemic.      - The uncertain (but likely increased) risk of nosocomial infection with COVID-19 given the hospital environment and the inability to social distance during a procedure.      - The possible impact of pandemic-associated resource shortages and changes in hospital operations on the postoperative care and experience.      - The possible complications from a COVID-19 infection.     The patient has had an opportunity to ask questions, and these questions were answered to satisfaction.  The patient expressed understanding.        HPI:   49 y.o. male     s/p IPP 05/06/19  ????????????????????????Cylinders: AMS CX 18cm  ????????????????????????Rear tips (right): 1cm  ????????????????????????Rear tips (left): 1cm  ????????????????????????Reservoir volume: 90mL  ????????????????????????Reservoir side: Left  ????????????????????????Reservoir location: SOR  ??  Tony Perkins 2/3. Doing well, small area of superficial wound dehiscence, so made appointment with me.    06/13/19.   No major changes  Doing well  No pain  Used IPP successfully  No fevers/chills    Interval history:  Recently noted right cylinder herniation into scrotum. Previously noted a bulge at the right base. No trauma.    Past history reviewed and unchanged    ROS:   A comprehensive 10-system review was negative, except as noted in HPI.  The patient was asked to review all abnormal responses not pertinent to today's visit with their primary care physician.    BP 160/90 (BP Site: L Arm, BP Position: Sitting, BP Cuff Size: Large)  - Pulse 65  - Temp 36.2 ??C (97.1 ??F) (Temporal)  - Ht 165.1 cm (5' 5)  - Wt 97.5 kg (215 lb)  - BMI 35.78 kg/m??     Physical Exam:    General: well developed, well nourished, no acute distress  HEENT: PERLA, EOM intact, normocephalic, atraumatic  Neck: supple and no masses  Chest: symmetrical  Lungs: non-labored breathing  Heart: normal rhythm, no JVD  Abdomen: no tenderness, no masses or hernias, no palpable organomegaly  GU:  Incision well healed  Left cylinder well positioned  Right cylinder in scrotum  Pump well positioned  Reservoir not palp  No erythema, induration, fluctuance, device tendernes  Device cycles appropriately  Extremities: no deformities, no edema, no cyanosis

## 2019-10-24 ENCOUNTER — Ambulatory Visit: Payer: 59 | Admitting: Podiatry

## 2019-10-24 ENCOUNTER — Other Ambulatory Visit: Payer: Self-pay

## 2019-10-24 DIAGNOSIS — M19071 Primary osteoarthritis, right ankle and foot: Secondary | ICD-10-CM | POA: Diagnosis not present

## 2019-10-24 DIAGNOSIS — G5791 Unspecified mononeuropathy of right lower limb: Secondary | ICD-10-CM | POA: Diagnosis not present

## 2019-10-24 DIAGNOSIS — M25571 Pain in right ankle and joints of right foot: Secondary | ICD-10-CM

## 2019-10-24 NOTE — Unmapped (Signed)
Patient call and said that Dr. Guy Sandifer call him on Saturday and said that he could be add on for surgery for 11/02/19 please call patient to confirm this date. Thanks so much

## 2019-10-24 NOTE — Progress Notes (Signed)
  Subjective:  Patient ID: Manuel Stevenson, male    DOB: [birth year],  MRN: [id number]  Chief Complaint  Patient presents with  . tarsitis    F?U Rt neuritis, arthritis, tarsitis pt. states," dose pack helped for a little bit, but pain is back to the same; 6-7/10." Tx: custome inserts -w/ swlling    49 y.o. male presents with the above complaint. History confirmed with patient.   Objective:  Physical Exam: warm, good capillary refill, no trophic changes or ulcerative lesions, normal DP and PT pulses and normal sensory exam. Left Foot: normal exam, no swelling, tenderness, instability; ligaments intact, full range of motion of all ankle/foot joints  Right Foot: pain at the sinus tarsi and the right ankle lateral gutter, pain medial calc tuber.   Assessment:   1. Sinus tarsitis, right   2. Arthritis of right ankle   3. Neuritis of right foot    Plan:  Patient was evaluated and treated and all questions answered.  Arthritis, Subtalar/Ankle -He continues to have pain, only mildly improved with medrol pack. -Will make appt with orthotist for AFO brace. It would need to be able to fit into his work boots.  -Consider STJ fusion at later date should pain persist. Will need CT for further eval.  Neuralgia -Not much pain but with continued local neuropathy.  No follow-ups on file.

## 2019-10-25 NOTE — Unmapped (Signed)
Pt said that he spk with Dr Guy Sandifer. Dr Guy Sandifer said that maybe he could have surgery on 7/7.please call [phone]

## 2019-10-27 NOTE — Unmapped (Signed)
Pt needing information in regards to upcoming surgery 7/7

## 2019-10-31 NOTE — Unmapped (Signed)
Urology Preoperative Phone Note:    Left a message for Tony Perkins about his upcoming surgery.    COVID Status  Vaccinated - unknown - was told to bring proof of vaccination or negative covid test    Medications:  Does not take anticoagulation medication.     Takes ASA 81 which he can continue      Cardiac Clearance:  - Not required    Urine Culture:  Result 1   Date Value Ref Range Status   01/02/2012 Presumptive Group B Streptococcus  Final     Comment:     4+  Group B Strep are susceptible to ampicillin and penicillin.  For penicillin-allergic patients please consult the  Microbiology Lab [phone]) for further susceptibility testing.      CrCl cannot be calculated (Patient's most recent lab result is older than the maximum 28 days allowed.).   Allergies   Allergen Reactions   ??? Codeine Nausea And Vomiting   ??? Dilaudid [Hydromorphone] Nausea And Vomiting        General Instructions:  - Reminded about n.p.o. status prior to the procedure. He was instructed to call the clinic with any further issues between now and surgery.  - Reminded that they would receive a phone call from [facility] the day prior to surgery regarding their time of arrival  - Reminded they need a ride home from the hospital

## 2019-10-31 NOTE — Unmapped (Signed)
I returned a phone call to [name].  I went over preop instructions ahead of his IPP revision with Dr. Guy Sandifer on Wednesday the seventh.  He will bring his vaccination card as well as remain n.p.o. starting midnight the night before his procedure.  He will be in the hospital by noon on Wednesday the seventh.    He did inform me that he recently had an urgent care visit for head cold.  He was tested for Covid which resulted negative.  He was started on Augmentin and a steroid pack.  He said he is currently asymptomatic and asked whether we should proceed with the surgery.  I informed him that he should come in as planned on the seventh and as long as he is asymptomatic and deemed safe from an anesthesia and surgical standpoint that we could likely proceed.  However I will make Dr. Guy Sandifer and Dr. Hart Robinsons aware of the circumstances.

## 2019-11-02 ENCOUNTER — Ambulatory Visit: Admit: 2019-11-02 | Discharge: 2019-11-02 | Payer: BLUE CROSS/BLUE SHIELD

## 2019-11-02 ENCOUNTER — Encounter
Admit: 2019-11-02 | Discharge: 2019-11-02 | Payer: BLUE CROSS/BLUE SHIELD | Attending: Certified Registered" | Primary: Certified Registered"

## 2019-11-02 DIAGNOSIS — N529 Male erectile dysfunction, unspecified: Principal | ICD-10-CM

## 2019-11-02 MED ORDER — ONDANSETRON 4 MG DISINTEGRATING TABLET
ORAL_TABLET | Freq: Three times a day (TID) | ORAL | 0 refills | 4.00000 days | Status: CP | PRN
Start: 2019-11-02 — End: 2019-11-02

## 2019-11-02 MED ORDER — SENNOSIDES 8.6 MG TABLET
ORAL_TABLET | Freq: Every day | ORAL | 0 refills | 30.00000 days | Status: CP
Start: 2019-11-02 — End: 2019-11-02

## 2019-11-02 MED ORDER — AMOXICILLIN 875 MG-POTASSIUM CLAVULANATE 125 MG TABLET: 1 | tablet | Freq: Two times a day (BID) | 0 refills | 10 days | Status: AC

## 2019-11-02 MED ORDER — ONDANSETRON 4 MG DISINTEGRATING TABLET: 4 mg | tablet | Freq: Three times a day (TID) | 0 refills | 4 days | Status: AC

## 2019-11-02 MED ORDER — SENNOSIDES 8.6 MG TABLET: 2 | tablet | Freq: Every day | 0 refills | 30 days | Status: AC

## 2019-11-02 MED ORDER — OXYCODONE 5 MG TABLET
ORAL_TABLET | ORAL | 0 refills | 2.00000 days | Status: CP | PRN
Start: 2019-11-02 — End: 2019-11-07

## 2019-11-02 MED ORDER — OXYCODONE 5 MG TABLET: 5 mg | tablet | 0 refills | 2 days | Status: AC

## 2019-11-02 MED ORDER — AMOXICILLIN 875 MG-POTASSIUM CLAVULANATE 125 MG TABLET
ORAL_TABLET | Freq: Two times a day (BID) | ORAL | 0 refills | 10.00000 days | Status: CP
Start: 2019-11-02 — End: 2019-11-02

## 2019-11-02 MED ADMIN — ePHEDrine injection: INTRAVENOUS | @ 20:00:00 | Stop: 2019-11-02

## 2019-11-02 MED ADMIN — propofoL (DIPRIVAN) injection: INTRAVENOUS | @ 19:00:00 | Stop: 2019-11-02

## 2019-11-02 MED ADMIN — sodium chloride irrigation (NS) 0.9 % irrigation solution: @ 20:00:00 | Stop: 2019-11-02

## 2019-11-02 MED ADMIN — vancomycin (VANCOCIN) 1,500 mg in sodium chloride (NS) 0.9 % 500 mL IVPB: 1500 mg | INTRAVENOUS | @ 17:00:00 | Stop: 2019-11-02

## 2019-11-02 MED ADMIN — ondansetron (ZOFRAN) injection: INTRAVENOUS | @ 19:00:00 | Stop: 2019-11-02

## 2019-11-02 MED ADMIN — gentamicin (GARAMYCIN) 380 mg in sodium chloride (NS) 0.9 % 100 mL IVPB: 380 mg | INTRAVENOUS | @ 19:00:00 | Stop: 2019-11-02

## 2019-11-02 MED ADMIN — ePHEDrine injection: INTRAVENOUS | @ 19:00:00 | Stop: 2019-11-02

## 2019-11-02 MED ADMIN — midazolam (VERSED) injection: INTRAVENOUS | @ 19:00:00 | Stop: 2019-11-02

## 2019-11-02 MED ADMIN — fentaNYL (PF) (SUBLIMAZE) injection: INTRAVENOUS | @ 22:00:00 | Stop: 2019-11-02

## 2019-11-02 MED ADMIN — scopolamine (TRANSDERM-SCOP) 1 mg over 3 days topical patch 1.5 mg: 1 | TOPICAL | @ 18:00:00 | Stop: 2019-11-02

## 2019-11-02 MED ADMIN — ketorolac (TORADOL) injection: INTRAVENOUS | @ 22:00:00 | Stop: 2019-11-02

## 2019-11-02 MED ADMIN — lactated Ringers infusion: INTRAVENOUS | @ 22:00:00 | Stop: 2019-11-02

## 2019-11-02 MED ADMIN — lactated Ringers infusion: INTRAVENOUS | @ 19:00:00 | Stop: 2019-11-02

## 2019-11-02 MED ADMIN — dexamethasone (DECADRON) 4 mg/mL injection: INTRAVENOUS | @ 19:00:00 | Stop: 2019-11-02

## 2019-11-02 MED ADMIN — fentaNYL (PF) (SUBLIMAZE) injection: INTRAVENOUS | @ 20:00:00 | Stop: 2019-11-02

## 2019-11-02 MED ADMIN — gentamicin (GARAMYCIN) injection: @ 20:00:00 | Stop: 2019-11-02

## 2019-11-02 MED ADMIN — acetaminophen (OFIRMEV) 10 mg/mL injection: INTRAVENOUS | @ 22:00:00 | Stop: 2019-11-02

## 2019-11-02 MED ADMIN — lactated Ringers infusion: 10 mL/h | INTRAVENOUS | @ 17:00:00 | Stop: 2019-11-02

## 2019-11-02 MED ADMIN — diphenhydrAMINE (BENADRYL) injection: INTRAVENOUS | @ 20:00:00 | Stop: 2019-11-02

## 2019-11-02 MED ADMIN — lidocaine (XYLOCAINE) 20 mg/mL (2 %) injection: INTRAVENOUS | @ 19:00:00 | Stop: 2019-11-02

## 2019-11-02 MED ADMIN — fentaNYL (PF) (SUBLIMAZE) injection: INTRAVENOUS | @ 19:00:00 | Stop: 2019-11-02

## 2019-11-03 NOTE — Unmapped (Signed)
PREOPERATIVE DIAGNOSIS:  Erectile dysfunction, unspecified erectile dysfunction type [N52.9]  Malfunction penile prosthesis    PREOPERATIVE DIAGNOSIS  Same    PROCEDURES PERFORMED  Remove/replace multi-component IPP (81191)  Right distal corporoplasty (47829)  Left distal corporoplasty (56213)    SURGEONS  Surgeon(s) and Role:     * Turner Daniels, MD - Primary     * Colon Branch, MD - Resident - Assisting     ANESTHESIA  Anesthesiologist: Tomma Rakers, MD; My Tamsen Roers, MD  Anesthesia Provider: Durwin Nora, CRNA; Nicholes Rough, CRNA    SPECIMENS  Order Name Source Comment Collection Info Order Time   AEROBIC/ANAEROBIC CULTURE Scrotum Pre-op diagnosis:  Malfunction IPP Collected By: Turner Daniels, MD 11/02/2019  3:58 PM       IMPLANTS  Implant Name Type Inv. Item Serial No. Manufacturer Lot No. LRB No. Used Action   EXTENDER REAR TIP 0.5 FOR PENILE PROSTHESIS - [id number] Urological Implant EXTENDER REAR TIP 0.5 FOR PENILE PROSTHESIS  [employer] [id number] N/A 1 Implanted and Explanted       DRAINS  None    ESTIMATED BLOOD LOSS  20 mL    COMPLICATIONS  @ORCOMPL @    CONDITION  Stable    FINDINGS   Cylinders: CX MS 18cm PS IZ   Rear tips (right): None   Rear tips (left): None   Reservoir: AMS Conceal IZ   Reservoir volume: 90mL   Reservoir location: Left [place]      OPERATIVE TECHNIQUE     Informed consent was obtained. The patient's identity was confirmed in the pre-operative holding area, and he was brought back to the operating room by anesthesia. He was sedated and intubated. Broad-spectrum IV antibiotics were administered prior to incision.    The patient was positioned supine and frog-legged with one pillow supporting his knees and another pillow supporting his heels. Foley catheter was placed and removed once the bladder was empty. He was clipped, and a chlorhexadine scrub was performed. He was then prepped with chloraprep and once this dried he was draped. Skin was prepped once again with chloraprep, and gloves were changed.    A 4cm transverse incision was made. Dissection was carried through Dartos fascia.     Right distal cylinder had herniated through the corporotomy and was located in the scrotum, surrounded by a capsule. The capsule was opened, and the cylinder was removed from the corpus. There was some fluid in the capsule, and this was cultured. No other signs of infection. The scrotal portion of the capsule was removed so that we could adequately dissect the scrotal structures. Once we had adequate exposure. The left corpora was opened. The cylinder was noted to be kinked at the site of the corporotomy, and it was also not fully in the glans. The cylinder and pump were removed. We left connection to the reservoir intact.    Measurements were taken. Proximally, tract extended to the ischium. Distally, both tracts ended in distal penile shaft. Therefore, bilateral distal corporotomies were performed. Ventral circumcising incision was made. Ventral wall of the corpora were opened transversely. Incision was made in the posterior capsule. Foley catheter was placed to help avoid urethral injury. A new space was created through the erectile tissue to the end of the corpora in the mid glans. This was done on both sides. Stay sutures were placed. Measurements were obtained. Previous cylinders measured 19cm bilaterally. Ultimately, we decided to downsize to 18cm. Cylinders were placed  in standard fashion and were noted to seat very well in the distal corpora and there was no kinking at corporotomy site. Corporotomies (bilateral, proximal and distal) were closed in a running fashion with vicryl.     Device was cycled several times, and functioned well There was excellent hemostasis.    Pump was placed in the standard fashion in a new location, as he wished for a slightly more concealed pump position. Pump position was excellent.    All spaces (scrotum, bilateral corpora - proximal and distal, pump space, and reservoir dspace) were copiously irrigated with >3L antibiotic solution throughout the case.  We confirmed that there was no crossover or urethral injury.     Dartos was closed in several layers in a running fashion. Skin was closed in an interrupted fashion with 4-0 monocryl and covered with skin glue. A Mummy wrap dressing was applied over the penis and scrotum with Kerlex.    The device was left fully deflated.    The procedure was tolerated well. There were no immediate complications.    I, Nancie Neas, was present, participating and scrubbed for the duration of the procedure.    PLAN  Trial of void in PACU. If patient voids spontaneously in PACU, OK to discharge. Otherwise, place 14-French Foley and notify Urology service so we can schedule catheter removal/TOV in nurse clinic on next business day.  Remove mummy wrap in AM POD 2  Follow-up in 6 weeks. Message sent to [facility] to request appointment.  No inflating device or intercourse/sexual activity for 6 weeks

## 2019-11-07 ENCOUNTER — Other Ambulatory Visit: Payer: 59 | Admitting: Orthotics

## 2019-11-08 NOTE — Unmapped (Signed)
[facility] Clinical Assessment & Refill Coordination Note    Tony Perkins, DOB: 12/08/1970  Phone: [phone] (home)     All above HIPAA information was verified with patient.     Was a Nurse, learning disability used for this call? No    Specialty Medication(s):   General Specialty: Praluent     Current Outpatient Medications   Medication Sig Dispense Refill   ??? alirocumab (PRALUENT) 150 mg/mL subcutaneous injection Inject the contents of 1 pen (150 mg total) under the skin every fourteen (14) days. 6 mL 4   ??? amoxicillin-clavulanate (AUGMENTIN) 875-125 mg per tablet Take 1 tablet by mouth every twelve (12) hours for 10 days. 20 tablet 0   ??? ascorbic acid (VITAMIN C) 1000 MG tablet Take 1,000 mg by mouth Two (2) times a day.      ??? aspirin (ECOTRIN) 81 MG tablet Take 81 mg by mouth daily.     ??? carvediloL (COREG) 12.5 MG tablet Take 1 tablet (12.5 mg total) by mouth Two (2) times a day. 180 tablet 1   ??? empty container Misc Use as directed to dispose of injectable medications 1 each 3   ??? ezetimibe (ZETIA) 10 mg tablet Take 1 tablet (10 mg total) by mouth daily. 30 tablet 11   ??? levothyroxine (SYNTHROID) 112 MCG tablet Take 1 tablet (112 mcg total) by mouth daily. 60 tablet 5   ??? lisinopriL (PRINIVIL,ZESTRIL) 10 MG tablet Take 1 tablet (10 mg total) by mouth two (2) times a day. 180 tablet 3   ??? omeprazole (PRILOSEC) 20 MG capsule Take 1 capsule (20 mg total) by mouth daily as needed. 90 capsule 3   ??? ondansetron (ZOFRAN-ODT) 4 MG disintegrating tablet Take 1 tablet (4 mg total) by mouth every eight (8) hours as needed for nausea for up to 7 days. 10 tablet 0   ??? senna (SENNA) 8.6 mg tablet Take 2 tablets by mouth daily. 60 tablet 0   ??? syringe with needle (TERUMO SYRINGE) 3 mL 23 gauge x 1 1/2 Syrg 1 Syringe by Miscellaneous route once a week. 25 each 1   ??? tadalafiL (CIALIS) 5 MG tablet Take 1 tablet (5 mg total) by mouth daily. 90 tablet 3   ??? testosterone cypionate (DEPOTESTOTERONE CYPIONATE) 200 mg/mL injection Inject 0.6 mL (120 mg total) into the muscle once a week. Single use Vials 4 mL 5     No current facility-administered medications for this visit.        Changes to medications: Conrado reports no changes at this time.    Allergies   Allergen Reactions   ??? Codeine Nausea And Vomiting   ??? Dilaudid [Hydromorphone] Nausea And Vomiting       Changes to allergies: No    SPECIALTY MEDICATION ADHERENCE     Praluent 150 mg/ml: 4 days of medicine on hand        Specialty medication(s) dose(s) confirmed: Regimen is correct and unchanged.     Are there any concerns with adherence? No    Adherence counseling provided? Not needed    CLINICAL MANAGEMENT AND INTERVENTION      Clinical Benefit Assessment:    Do you feel the medicine is effective or helping your condition? Yes    Clinical Benefit counseling provided? Labs from 09/23/19 show evidence of clinical benefit    Adverse Effects Assessment:    Are you experiencing any side effects? No. Patient was having side effects (flu-like symptoms and fatigue) from  Repatha. Per provider recommendation, he stopped Repatha and took a break for a month to let symptoms subside. Patient then started Praluent on 10/29/19. So far, he has experienced no side effects from Praluent.    Are you experiencing difficulty administering your medicine? No    Quality of Life Assessment:    How many days over the past month did your high cholesterol  keep you from your normal activities? For example, brushing your teeth or getting up in the morning. 0    Have you discussed this with your provider? Not needed    Therapy Appropriateness:    Is therapy appropriate? Yes, therapy is appropriate and should be continued    DISEASE/MEDICATION-SPECIFIC INFORMATION      For patients on injectable medications: Patient currently has 1 doses left.  Next injection is scheduled for 11/12/19.    PATIENT SPECIFIC NEEDS     - Does the patient have any physical, cognitive, or cultural barriers? No - Is the patient high risk? No     - Does the patient require a Care Management Plan? No     - Does the patient require physician intervention or other additional services (i.e. nutrition, smoking cessation, social work)? No      SHIPPING     Specialty Medication(s) to be Shipped:   General Specialty: Praluent    Other medication(s) to be shipped: none at this time     Changes to insurance: No    Delivery Scheduled: Yes, Expected medication delivery date: 11/17/19.     Medication will be delivered via UPS to the confirmed prescription address in [facility].    The patient will receive a drug information handout for each medication shipped and additional FDA Medication Guides as required.  Verified that patient has previously received a Conservation officer, historic buildings.    All of the patient's questions and concerns have been addressed.    Camillo Flaming   [facility] Specialty Pharmacist

## 2019-11-09 NOTE — Progress Notes (Signed)
Subjective:  Patient ID: Manuel Stevenson, male    DOB: 1970-07-27,  MRN: [id number]  Chief Complaint  Patient presents with  . Routine Post Op    Pt. states," it still hurts off and on; 6-7/10 at ngiht and durring the day 3/10." Tx: cruthces, boot and PT -w/ swelling, numbness,tinglin and nerve pain at top of foot    DOS: 09/08/2018 Procedure: R Calcaneus ORIF  49 y.o. male returns for post-op check. Hx as above.  Review of Systems: Negative except as noted in the HPI. Denies N/V/F/Ch.  No past medical history on file.  Current Outpatient Medications:  .  amoxicillin-clavulanate (AUGMENTIN) 875-125 MG tablet, , Disp: , Rfl:  .  ascorbic acid (VITAMIN C) 1000 MG tablet, Take by mouth., Disp: , Rfl:  .  calcipotriene (DOVONOX) 0.005 % ointment, , Disp: , Rfl:  .  carvedilol (COREG) 6.25 MG tablet, Take 6.25 mg by mouth 2 (two) times daily., Disp: , Rfl:  .  Diclofenac Sodium POWD, , Disp: , Rfl:  .  Evolocumab 140 MG/ML SOAJ, Inject into the skin., Disp: , Rfl:  .  ezetimibe (ZETIA) 10 MG tablet, Take by mouth., Disp: , Rfl:  .  gabapentin (NEURONTIN) 300 MG capsule, Take 1 capsule (300 mg total) by mouth 4 (four) times daily., Disp: 60 capsule, Rfl: 0 .  HYDROcodone-acetaminophen (NORCO) 5-325 MG tablet, Take 1 tablet by mouth every 4 (four) hours as needed for moderate pain., Disp: 12 tablet, Rfl: 0 .  levothyroxine (SYNTHROID) 112 MCG tablet, Take by mouth., Disp: , Rfl:  .  lisinopril (ZESTRIL) 10 MG tablet, lisinopril 10 mg tablet  Take 1 tablet every day by oral route., Disp: , Rfl:  .  methylPREDNISolone (MEDROL DOSEPAK) 4 MG TBPK tablet, 6 Day Taper Pack. Take as Directed., Disp: 21 tablet, Rfl: 0 .  omeprazole (PRILOSEC) 10 MG capsule, omeprazole 10 mg capsule,delayed release  Take 2 capsules every day by oral route., Disp: , Rfl:  .  omeprazole (PRILOSEC) 20 MG capsule, Take 20 mg by mouth daily as needed., Disp: , Rfl:  .  ondansetron (ZOFRAN) 4 MG tablet, Take 1 tablet (4 mg  total) by mouth every 8 (eight) hours as needed for nausea or vomiting., Disp: 20 tablet, Rfl: 0 .  ondansetron (ZOFRAN-ODT) 4 MG disintegrating tablet, , Disp: , Rfl:  .  oxyCODONE (ROXICODONE) 5 MG immediate release tablet, Take 1 tablet (5 mg total) by mouth every 4 (four) hours as needed for severe pain., Disp: 20 tablet, Rfl: 0 .  oxyCODONE-acetaminophen (PERCOCET) 10-325 MG tablet, Take 1 tablet by mouth every 4 (four) hours as needed for pain., Disp: 20 tablet, Rfl: 0 .  pregabalin (LYRICA) 50 MG capsule, Take 1 capsule (50 mg total) by mouth 3 (three) times daily., Disp: 90 capsule, Rfl: 0 .  tadalafil (CIALIS) 5 MG tablet, Take 5 mg by mouth daily., Disp: , Rfl:  .  testosterone cypionate (DEPOTESTOSTERONE CYPIONATE) 200 MG/ML injection, INJECT 0.4 ML INTRAMUSCULARLY ONCE A WEEK, Disp: , Rfl:  .  traMADol (ULTRAM) 50 MG tablet, Take 1 tablet (50 mg total) by mouth every 6 (six) hours as needed., Disp: 30 tablet, Rfl: 0  Social History   Tobacco Use  Smoking Status Never Smoker  Smokeless Tobacco Never Used    Allergies  Allergen Reactions  . Codeine Other (See Comments) and Nausea And Vomiting   Objective:   Vitals:   12/20/18 1102  Resp: 16  Temp: 97.7 F (36.5 C)  There is no height or weight on file to calculate BMI. Constitutional Well developed. Well nourished.  Vascular Foot warm and well perfused. Capillary refill normal to all digits.   Neurologic Normal speech. Oriented to person, place, and time. Epicritic sensation to light touch grossly present bilaterally. Diminished over sural nerve distribution  Dermatologic Small superficial dehiscence without warmth erythema signs of infection.  Orthopedic: Tenderness to palpation noted about the surgical site. Good subtalar motion without crepitus. 3/5 MMT DF/PF/Inv/Ev   Assessment:   1. Post-operative state    Plan:  Patient was evaluated and treated and all questions answered.  S/p foot surgery right -XR:  Reviewed as above. -WB Status: 2 crutch assist WB in CAM boot RLE -Continue PT. Transition WB with PT.   Sural Neuritis -Continue topical neuropathic pain cream  Return in about 3 weeks (around 01/10/2019) for Post-op.

## 2019-11-14 NOTE — Unmapped (Signed)
The patient was a no show for their New patient evaluation at the [facility].

## 2019-11-14 NOTE — Unmapped (Unsigned)
Department of Anesthesiology  [facility]  [address]  [place], [place] [address]  [phone]    Date: November 13, 2019  Patient Name: Tony Perkins  MRN: [id number]  PCP: Rolm Bookbinder  Referring Provider: Parke Poisson, MD    Assessment:   Attending: Freman Perkins is a 49 y.o. male with a PMHx of ***. [facility] regarding his chronic *** pain.    Today ***    The patient {ajlortcomplete:54086::completed} the Opioid Risk Tool (ORT) 11/13/19 to help identify risk related to opiate abuse and/or misuse (Health and Behavior Assessment-15 mins). This measure assesses risk factors for opiate abuse and/or misuse in adults with chronic pain, with empirically validated risk factors including: Family history of substance abuse, personal history of substance abuse (alcohol, illicit drugs, prescription drugs), age, history of preadolescent sexual abuse, and psychological comorbidity (depression, other mental health comorbidity). I have supplied this to the patient and have personally assessed and scored it.  -The patient had {ortnumbers:48685} points, for {ortlist:48684}  {ajlort2:45583}     Patient does  appear to be utilizing pain medications appropriately and does  report that the medications do improve patient's quality of life and functionality level.  At today's visit, the patient reports {ajlmedresponse:47259} analgesia from their current medication regimen {with/without:43712} significant adverse effects.    Current Pain Provider: no  Urine toxicology: None  Urine toxicology screen N/A appropriate.   Last Opioid Change: {loboncoption:45525}  Last EKG: QTC 413 ms on 03/10/19  Previous Compliance Issues: {None:21267}  Naloxone ordered: {ajlyesnoother:47260}  Total morphine equivalents: ***  Benzodiazepine: {YES/NO:21013}  Pain Psychology: {ajlpainpsych:59727::N/A}  [place] DOC: None  [place]CCS[place] database was reviewed 11/13/19.      No diagnosis found.    General Recommendations: The pain condition that the patient suffers from is best treated with a multidisciplinary approach that involves an increase in physical activity to prevent de-conditioning and worsening of the pain cycle, as well as psychological counseling (formal and/or informal) to address the co-morbid psychological affects of pain.  Treatment will often involve judicious use of pain medications and interventional procedures to decrease the pain, allowing the patient to participate in the physical activity that will ultimately produce long-lasting pain reductions.  The goal of the multidisciplinary approach is to return the patient to a higher level of overall function and to restore their ability to perform activities of daily living.      Plan:     -Please note that if you have referred this patient for opioid management, that we will not prescribe any controlled substances until our evaluation is complete, satisfactory, and we have assumed opioid prescribing duties with an opioid agreement. As such, any controlled substances must come from the referring provider if indicated.     ***    Future Considerations:  ***    -No follow-ups on file.    No orders of the defined types were placed in this encounter.    Requested Prescriptions      No prescriptions requested or ordered in this encounter       Subjective:     HPI:  [name] is seen in consultation at the request of Parke Poisson, MD  For evaluation and recommendations regarding His chronic *** pain.     Today the patient ***    Pain Clinic? No    Current Medications:  ***    His  pain started approximately  {NUMBERS:20191} {TIME; DAYS-YEARS:20939} ago.  His pain involving {dljright:27057} {AMB PAIN 1:25272}.    His pain is described as {ajlpaindescription:47264}  Tony Perkins is  presenting to the clinic today with pain that is graded as {numbers 0-10:5044}/10 in intensity. His highest level of pain is reported as {numbers 0-10:5044}/10 in intensity, least pain level is {0 - 10:19007}/10 and average pain level is {numbers 0-10:5044}/10.  His pain started {painstart:34630}.  His pain symptoms {Actions; are/are not:16769} associated with {PAIN [phone]}.  The patient {HAS HAS ZOX:09604} experienced lost of bowel or bladder control.  His pain is made worse with {PAIN WORSE VWUJ:[id number]}.  His pain is present {painpresent:34631}   His pain is improved with {PAIN IMPROVEMENT:[phone]}.     Previous interventions include {ajlprevioustrials:51236}    Previous tests include {ajltests:45132}  Workmans Compensation {HRT options:36140} involved.  This {HRT options:36140} involved with a lawsuit or lawyer  The treatment goals include {ajlgoals:45133}    Previous Medication Trials: {ajlmeds:45134}    Past Medical History:   Diagnosis Date   ??? Coronary artery disease     MI July 23, 2013   ??? Diabetes mellitus (CMS-HCC)    ??? Disease of thyroid gland    ??? Erectile dysfunction associated with type 2 diabetes mellitus (CMS-HCC)    ??? Gastroesophageal reflux    ??? Hyperlipidemia    ??? Hypertension    ??? Hypogonadism male    ??? Hypothyroidism    ??? Obesity (BMI 30-39.9)    ??? Sarcoidosis of lung (CMS-HCC)      Past Surgical History:   Procedure Laterality Date   ??? ABDOMINAL SURGERY     ??? BACK SURGERY N/A    ??? CHOLECYSTECTOMY     ??? CORONARY ANGIOPLASTY WITH STENT PLACEMENT  July 23, 2013   ??? PR CATH PLACE/CORON ANGIO, IMG SUPER/INTERP,W LEFT HEART VENTRICULOGRAPHY N/A 01/17/2015    Procedure: Left Heart Catheterization;  Surgeon: Marlaine Hind, MD;  Location: [facility] CATH;  Service: Cardiology   ??? PR CATH PLACE/CORON ANGIO, IMG SUPER/INTERP,W LEFT HEART VENTRICULOGRAPHY N/A 02/12/2018    Procedure: Left Heart Catheterization;  Surgeon: Rosana Hoes, MD;  Location: [facility] CATH;  Service: Cardiology   ??? PR EXC SKIN BENIG <0.5 CM REMAINDER BODY N/A 05/25/2017    Procedure: EXCISION, BENIGN LESION INCLUDING MARGINS, GENITALIA; EXCISED DIAMETER 0.5 CM OR LESS;  Surgeon: Lazarus Gowda, MD;  Location: [facility] OR [facility];  Service: Urology   ??? PR IMPLANT SPINAL NEUROSTIM/RECEIVER Midline 04/15/2013    Procedure: INSERTION OR REPLACEMENT SPINAL NEUROSTIMULATOR PULSE GENERATOR OR RECEIVER, DIRECT OR INDUCTIVE COUPLING;  Surgeon: Marygrace Drought, MD;  Location: MAIN OR [facility];  Service: Anesthesiology   ??? PR INSERT,INFLATABLE PENILE PROSTHESIS N/A 05/06/2019    Procedure: R14 Insertion of inflatable penile prosthesis;  Surgeon: Turner Daniels, MD;  [facility];  Service: Urology   ??? PR PERCUT IMPLNT NEUROELECT,EPIDURAL Midline 04/15/2013    Procedure: PERCUTANEOUS IMPLANTATION OF NEUROSTIMULATOR ELECTRODE ARRAY; EPIDURAL;  Surgeon: Marygrace Drought, MD;  Location: MAIN OR [facility];  Service: Anesthesiology   ??? PR RECONSTRUC ANT MALE URETHRA N/A 12/01/2017    Procedure: URETHROPLASTY, ONE-STAGE RECONSTRUCTION OF MALE ANTERIOR URETHRA;  Surgeon: Turner Daniels, MD;  [facility]: [facility] OR [facility];  Service: Urology   ??? PR REPAIR,INFLATABLE PENILE PROSTHESIS N/A 11/02/2019    Procedure: REPAIR OF COMPONENT(S) OF A MULTI-COMPONENT, INFLATABLE PENILE PROSTH;  Surgeon: Turner Daniels, MD;  [facility];  Service: Urology   ???  SPINAL FUSION     ??? SPINE SURGERY     ??? TONSILLECTOMY N/A      Family History   Problem Relation Age of Onset   ??? Stroke Mother    ??? Heart disease Father    ??? Anesthesia problems Neg Hx    ??? Bleeding Disorder Neg Hx        Social History:  He reports that he quit smoking about 27 years ago. His smoking use included cigarettes. He quit after 0.50 years of use. He has never used smokeless tobacco. He reports that he does not drink alcohol and does not use drugs.    The patient is {loboncmarital:42395}  The patient has children: {ajlchildren:43507}  The patient is {loboncliving:42396}  Highest level of education: {loboncschool:42397}  Current Employment: {loboncjob:42399} Occupation: ***  Exercise: ***  Recreational Drugs: {YES NO [id number]  Treatment for Substance abuse: {YES NO L6539673  Use anothers prescription medications: {YES NO MWUXL:24401}    Allergies as of 11/14/2019 - Reviewed 11/02/2019   Allergen Reaction Noted   ??? Codeine Nausea And Vomiting 08/16/2012   ??? Dilaudid [hydromorphone] Nausea And Vomiting 05/06/2019      Current Outpatient Medications   Medication Sig Dispense Refill   ??? alirocumab (PRALUENT) 150 mg/mL subcutaneous injection Inject the contents of 1 pen (150 mg total) under the skin every fourteen (14) days. 6 mL 4   ??? ascorbic acid (VITAMIN C) 1000 MG tablet Take 1,000 mg by mouth Two (2) times a day.      ??? aspirin (ECOTRIN) 81 MG tablet Take 81 mg by mouth daily.     ??? carvediloL (COREG) 12.5 MG tablet Take 1 tablet (12.5 mg total) by mouth Two (2) times a day. 180 tablet 1   ??? empty container Misc Use as directed to dispose of injectable medications 1 each 3   ??? ezetimibe (ZETIA) 10 mg tablet Take 1 tablet (10 mg total) by mouth daily. 30 tablet 11   ??? levothyroxine (SYNTHROID) 112 MCG tablet Take 1 tablet (112 mcg total) by mouth daily. 60 tablet 5   ??? lisinopriL (PRINIVIL,ZESTRIL) 10 MG tablet Take 1 tablet (10 mg total) by mouth two (2) times a day. 180 tablet 3   ??? omeprazole (PRILOSEC) 20 MG capsule Take 1 capsule (20 mg total) by mouth daily as needed. 90 capsule 3   ??? senna (SENNA) 8.6 mg tablet Take 2 tablets by mouth daily. 60 tablet 0   ??? syringe with needle (TERUMO SYRINGE) 3 mL 23 gauge x 1 1/2 Syrg 1 Syringe by Miscellaneous route once a week. 25 each 1   ??? tadalafiL (CIALIS) 5 MG tablet Take 1 tablet (5 mg total) by mouth daily. 90 tablet 3   ??? testosterone cypionate (DEPOTESTOTERONE CYPIONATE) 200 mg/mL injection Inject 0.6 mL (120 mg total) into the muscle once a week. Single use Vials 4 mL 5     No current facility-administered medications for this visit.       Imaging/Tests:  ***    Urine toxiciology screen  No results found for: AMPHU, BARBU, BENZU, CANNAU, METHU, OPIAU, COCAU    OPIOID CONFIRMATION:  No results found for: CDIFFTOX, NBUPR, LABCO, HYDROCODONE, HYDROMORPH, MORPHINE, OXYCODONE, OXMU, MAMU, OPIU     BENZODIAZEPINE CONFIRMATION:  No results found for: CDIFFTOX, CDIFFTOX, OHALP, CLONU, HDXYFLRAZUR, 7NHFLU, DESALKYCONF, LORAZURQT, HDXYTRIAZUR, MIDAZURQT, BNZU    Review of Systems:  GENERAL: {nikaROSgen:23990}  HEENT: {NIKAROSENT:24068}  SKIN:{nikaROSskin:23995}  PULMONARY:{nikarospulm:24070}  ENDOCRINE: {nikaROSendoc:23997}  GASTROINTESTINAL:{nikaROSGI:23994}  GENITOURINARY:{nikarosgu:24073:a}  MUSCULOSKELETAL:{nikaROSmusculosk:23999:a}  CARDIOVASCULAR:{nikaROSCV:23991:a}  NEUROLOGIC:{nikaROSneur:24000:a}  PSYCHIATRIC: {nikaROSpsych:24001:a}  He {AMB Pain denies/[phone]} any homicidal or suicidal ideation.   HEMATOLOGIC: {nikaroshema:24075:a}  IMMUNOLOGIC: {nikarosimmuno:24076:a}      Objective:     PHYSICAL EXAM:  There were no vitals taken for this visit.  Wt Readings from Last 3 Encounters:   11/02/19 92.8 kg (204 lb 8 oz)   10/20/19 97.5 kg (215 lb)   09/23/19 94.3 kg (208 lb)     GENERAL: Well developed, well-nourished {loboncobese:42394} and is in no apparent distress. The patient is pleasant and interactive. Patient is a good historian.  No evidence of sedation or intoxication.  No overt pain behaviors  HEENT: Normocephalic/atraumatic. Clear sclera. Mucous membranes are moist.  CARDIOVASCULAR:  {ajlcardiacexam:61906}  RESPIRATORY:  {ajlpulmexam:61907::Normal work of breathing, no supplemental 02}  EXTREMITIES: No clubbing, cyanosis noted.  Joints appear normal.  GASTROINTESTINAL: Soft, nondistended  NEUROLOGIC:  Alert and oriented, speech fluent, normal language. Cranial nerves grossly intact.  Sensation {ajlneuroexam:62877}. Reflexes were {Reflexes:140032::Normal} and {were/were not:54765} equal in upper and lower extremities  MUSCULOSKELETAL:  Motor function  {Pain Motor Numbers:[phone]::5/5} in {ajlupperlower:48905::upper,lower} extremities with normal tone and bulk. Good range of motion of all extremities. Patient rises from a seated position with {ajlambulate3:46823::no} difficulty.  The patient was able to ambulate with {ajlambulate3:46823::no} difficulty throughout the clinic today {With/without:5700} the assistance of a walking aid-{ajlcane:46931::None}.     SPINE:The patient {DOES /DOES UJW:11914} have pain with palpation of the {Cervical Thoracic Lumbar:47568} back with spine percussion and deep muscular palpation.  {AMB Pain Normal/Abnormal:[phone]::Normal} flexion and {AMB Pain Normal/Abnormal:[phone]::Normal} extension at the waist. {AMB Pain Positive/Negative:([phone] 084-7031} pain on facet loading procedures. {Pain Good/Poor:[phone]::Good } lateral extension and flexion as well.  Surgical scaring {HRT options:36140} noted on spine. Patrick's maneuver {AMB Pain Positive/Negative:([phone]}{Right/left:16020}. Straight leg raising {AMB Pain Positive/Negative:(205) 084-7031} {Right/left:16020}.   SKIN: No obvious rashes lesions or erythema on the exposed skin  PSYCHIATRIC:Appropriate, full range affect, no psychomotor retardation.    I have personally reviewed the patient's medical record.   I have personally reviewed the patient's clinical labs and/or urine toxicology studies  I have independently reviewed patient's imaging studies.   I {ACTIONS; HAVE/HAVE [id number] requested patient's old medical records from the prior provider.   The patient's significant other and/or family member/friend {was/was not:[phone]::was not} present during this visit and has contributed important medical information as pertinent to patient's medical history.   I have reviewed the  Narcotic Database today.

## 2019-11-16 MED FILL — PRALUENT PEN 150 MG/ML SUBCUTANEOUS PEN INJECTOR: SUBCUTANEOUS | 28 days supply | Qty: 2 | Fill #1

## 2019-11-16 MED FILL — PRALUENT PEN 150 MG/ML SUBCUTANEOUS PEN INJECTOR: 28 days supply | Qty: 2 | Fill #1 | Status: AC

## 2019-11-21 ENCOUNTER — Ambulatory Visit: Payer: 59 | Admitting: Orthotics

## 2019-11-21 ENCOUNTER — Other Ambulatory Visit: Payer: Self-pay

## 2019-11-21 DIAGNOSIS — M25571 Pain in right ankle and joints of right foot: Secondary | ICD-10-CM

## 2019-11-21 DIAGNOSIS — G5791 Unspecified mononeuropathy of right lower limb: Secondary | ICD-10-CM

## 2019-11-21 DIAGNOSIS — M19071 Primary osteoarthritis, right ankle and foot: Secondary | ICD-10-CM

## 2019-11-21 NOTE — Progress Notes (Signed)
Patient presents today for evaluation/casting for AFO brace (R).   Patient has hx of the following conditions: Gait instability,  Hx of fracture (fell off ladder) Ankle instabilty,  Gait analysis done and patient displays abnormality of gait in both sagittial and frontal planes, and could benefit in aggressive ankle support.  Patient chose [place] brace w/ lace/speed laces.

## 2019-11-25 ENCOUNTER — Ambulatory Visit: Admit: 2019-11-25 | Payer: BLUE CROSS/BLUE SHIELD | Attending: Cardiovascular Disease | Primary: Cardiovascular Disease

## 2019-11-25 DIAGNOSIS — E785 Hyperlipidemia, unspecified: Principal | ICD-10-CM

## 2019-12-05 ENCOUNTER — Telehealth: Payer: Self-pay | Admitting: Podiatry

## 2019-12-05 NOTE — Telephone Encounter (Signed)
Received voicemail from Sl[facility] @ [email or web address] asking for clinicals on pt for pending prior auth # [id number].Marland Kitchen Her direct # Is [phone] I have faxed them to [phone]

## 2019-12-19 ENCOUNTER — Ambulatory Visit: Admit: 2019-12-19 | Discharge: 2019-12-20 | Payer: BLUE CROSS/BLUE SHIELD | Attending: Urology | Primary: Urology

## 2019-12-19 DIAGNOSIS — N529 Male erectile dysfunction, unspecified: Principal | ICD-10-CM

## 2019-12-19 NOTE — Unmapped (Signed)
[facility] Refill Coordination Note    Specialty Medication(s) to be Shipped:   General Specialty: Praluent    Other medication(s) to be shipped: No additional medications requested for fill at this time     Tony Perkins, DOB: 1970-08-05  Phone: [phone] (home)       All above HIPAA information was verified with patient.     Was a Nurse, learning disability used for this call? No    Completed refill call assessment today to schedule patient's medication shipment from the [facility] [phone]).       Specialty medication(s) and dose(s) confirmed: Regimen is correct and unchanged.   Changes to medications: [name] reports no changes at this time.  Changes to insurance: No  Questions for the pharmacist: No    Confirmed patient received Welcome Packet with first shipment. The patient will receive a drug information handout for each medication shipped and additional FDA Medication Guides as required.       DISEASE/MEDICATION-SPECIFIC INFORMATION        For patients on injectable medications: Patient currently has 0 doses left.  Next injection is scheduled for 12/24/19.    SPECIALTY MEDICATION ADHERENCE     Medication Adherence    Patient reported X missed doses in the last month: 0  Specialty Medication: Praluent 150mg /ml  Patient is on additional specialty medications: No                Praluent 150 mg/ml: 0 days of medicine on hand          SHIPPING     Shipping address confirmed in Epic.     Delivery Scheduled: Yes, Expected medication delivery date: 12/22/19.     Medication will be delivered via UPS to the prescription address in Epic WAM.    Nancy Nordmann [facility] Specialty Technician

## 2019-12-19 NOTE — Unmapped (Signed)
Assessment:  s/p remove/replace IPP + distal corporoplasty 11/02/19 for herniation  Doing well    Plan:  Fu as needed    HPI:   49 y.o. male     s/p remove/replace IPP + distal corporoplasty 11/02/19 for herniation    Interval history:  Painful, but no other issues    Past history reviewed and unchanged    ROS:   A comprehensive 10-system review was negative, except as noted in HPI.  The patient was asked to review all abnormal responses not pertinent to today's visit with their primary care physician.    BP 186/96  - Pulse 75  - Temp 36.5 ??C (97.7 ??F) (Temporal)  - Resp 20  - Ht 165.1 cm (5' 5)  - Wt 97.5 kg (215 lb)  - SpO2 99%  - BMI 35.78 kg/m??     Physical Exam:    General: well developed, well nourished, no acute distress  HEENT: PERLA, EOM intact, normocephalic, atraumatic  Neck: supple and no masses  Chest: symmetrical  Lungs: non-labored breathing  Heart: normal rhythm, no JVD  Abdomen: no tenderness, no masses or hernias, no palpable organomegaly  GU:  Incision well healed  Cylinders well positioned  Pump well positioned  Reservoir not palp  No erythema, induration, fluctuance, device tendernes  Device cycles appropriately  Extremities: no deformities, no edema, no cyanosis

## 2019-12-21 MED FILL — PRALUENT PEN 150 MG/ML SUBCUTANEOUS PEN INJECTOR: 28 days supply | Qty: 2 | Fill #2 | Status: AC

## 2019-12-21 MED FILL — PRALUENT PEN 150 MG/ML SUBCUTANEOUS PEN INJECTOR: SUBCUTANEOUS | 28 days supply | Qty: 2 | Fill #2

## 2019-12-26 ENCOUNTER — Ambulatory Visit: Admit: 2019-12-26 | Discharge: 2019-12-27 | Payer: BLUE CROSS/BLUE SHIELD | Attending: Family | Primary: Family

## 2019-12-26 ENCOUNTER — Ambulatory Visit: Admit: 2019-12-26 | Discharge: 2019-12-27 | Payer: BLUE CROSS/BLUE SHIELD

## 2019-12-26 DIAGNOSIS — J029 Acute pharyngitis, unspecified: Principal | ICD-10-CM

## 2019-12-26 DIAGNOSIS — E785 Hyperlipidemia, unspecified: Principal | ICD-10-CM

## 2019-12-26 DIAGNOSIS — D860 Sarcoidosis of lung: Principal | ICD-10-CM

## 2019-12-26 DIAGNOSIS — I2510 Atherosclerotic heart disease of native coronary artery without angina pectoris: Principal | ICD-10-CM

## 2019-12-26 DIAGNOSIS — K21 Gastroesophageal reflux disease with esophagitis: Principal | ICD-10-CM

## 2019-12-26 DIAGNOSIS — R05 Cough: Principal | ICD-10-CM

## 2019-12-26 DIAGNOSIS — R0602 Shortness of breath: Principal | ICD-10-CM

## 2019-12-26 DIAGNOSIS — R5383 Other fatigue: Principal | ICD-10-CM

## 2019-12-26 DIAGNOSIS — I10 Essential (primary) hypertension: Principal | ICD-10-CM

## 2019-12-26 DIAGNOSIS — E039 Hypothyroidism, unspecified: Principal | ICD-10-CM

## 2019-12-26 LAB — COMPREHENSIVE METABOLIC PANEL
ALBUMIN: 3.9 g/dL (ref 3.4–5.0)
ALKALINE PHOSPHATASE: 84 U/L (ref 46–116)
ALT (SGPT): 19 U/L (ref 10–49)
ANION GAP: 10 mmol/L (ref 5–14)
AST (SGOT): 13 U/L (ref <=34)
BILIRUBIN TOTAL: 0.2 mg/dL — ABNORMAL LOW (ref 0.3–1.2)
BLOOD UREA NITROGEN: 15 mg/dL (ref 9–23)
BUN / CREAT RATIO: 15
CALCIUM: 9.1 mg/dL (ref 8.7–10.4)
CHLORIDE: 104 mmol/L (ref 98–107)
CO2: 22 mmol/L (ref 20.0–31.0)
CREATININE: 0.97 mg/dL
EGFR CKD-EPI AA MALE: >90 mL/min/{1.73_m2} (ref >=60)
EGFR CKD-EPI NON-AA MALE: >90 mL/min/{1.73_m2} (ref >=60)
GLUCOSE RANDOM: 184 mg/dL — ABNORMAL HIGH (ref 70–179)
POTASSIUM: 3.9 mmol/L (ref 3.4–4.5)
PROTEIN TOTAL: 7 g/dL (ref 5.7–8.2)
SODIUM: 136 mmol/L (ref 135–145)

## 2019-12-26 LAB — LIPID PANEL
CHOLESTEROL/HDL RATIO SCREEN: 4.9 — ABNORMAL HIGH (ref 1.0–4.5)
CHOLESTEROL: 219 mg/dL — ABNORMAL HIGH (ref <=200)
HDL CHOLESTEROL: 45 mg/dL (ref 40–60)
LDL CHOLESTEROL CALCULATED: 126 mg/dL — ABNORMAL HIGH (ref 40–99)
NON-HDL CHOLESTEROL: 174 mg/dL — ABNORMAL HIGH (ref 70–130)
TRIGLYCERIDES: 242 mg/dL — ABNORMAL HIGH (ref 0–150)
VLDL CHOLESTEROL CAL: 48.4 mg/dL (ref 11–50)

## 2019-12-26 LAB — CBC W/ AUTO DIFF
BASOPHILS ABSOLUTE COUNT: 0.1 10*9/L (ref 0.0–0.1)
BASOPHILS RELATIVE PERCENT: 0.9 %
EOSINOPHILS ABSOLUTE COUNT: 0.4 10*9/L (ref 0.0–0.4)
EOSINOPHILS RELATIVE PERCENT: 4.4 %
HEMATOCRIT: 43.6 % (ref 41.0–53.0)
HEMOGLOBIN: 14.9 g/dL (ref 13.5–17.5)
LARGE UNSTAINED CELLS: 1 % (ref 0–4)
LYMPHOCYTES ABSOLUTE COUNT: 1.2 10*9/L — ABNORMAL LOW (ref 1.5–5.0)
LYMPHOCYTES RELATIVE PERCENT: 13.5 %
MEAN CORPUSCULAR HEMOGLOBIN CONC: 34.3 g/dL (ref 31.0–37.0)
MEAN CORPUSCULAR HEMOGLOBIN: 30.8 pg (ref 26.0–34.0)
MEAN CORPUSCULAR VOLUME: 89.9 fL (ref 80.0–100.0)
MEAN PLATELET VOLUME: 8.5 fL (ref 7.0–10.0)
MONOCYTES ABSOLUTE COUNT: 0.4 10*9/L (ref 0.2–0.8)
MONOCYTES RELATIVE PERCENT: 4 %
NEUTROPHILS ABSOLUTE COUNT: 6.9 10*9/L (ref 2.0–7.5)
NEUTROPHILS RELATIVE PERCENT: 76.1 %
PLATELET COUNT: 375 10*9/L (ref 150–440)
RED BLOOD CELL COUNT: 4.85 10*12/L (ref 4.50–5.90)
RED CELL DISTRIBUTION WIDTH: 13.9 % (ref 12.0–15.0)
WBC ADJUSTED: 9.1 10*9/L (ref 4.5–11.0)

## 2019-12-26 LAB — C-REACTIVE PROTEIN: C-REACTIVE PROTEIN: <4 mg/L (ref <=10.0)

## 2019-12-26 LAB — TSH: THYROID STIMULATING HORMONE: 2.895 u[IU]/mL (ref 0.550–4.780)

## 2019-12-26 MED ORDER — LISINOPRIL 10 MG TABLET
ORAL_TABLET | Freq: Two times a day (BID) | ORAL | 3 refills | 90.00000 days | Status: CP
Start: 2019-12-26 — End: 2020-12-25

## 2019-12-26 MED ORDER — EZETIMIBE 10 MG TABLET
ORAL_TABLET | Freq: Every day | ORAL | 3 refills | 90 days | Status: CP
Start: 2019-12-26 — End: 2020-12-25

## 2019-12-26 MED ORDER — PREDNISONE 20 MG TABLET
ORAL_TABLET | ORAL | 0 refills | 11 days | Status: CP
Start: 2019-12-26 — End: 2020-01-06

## 2019-12-26 MED ORDER — CARVEDILOL 12.5 MG TABLET
ORAL_TABLET | Freq: Two times a day (BID) | ORAL | 3 refills | 90 days | Status: CP
Start: 2019-12-26 — End: 2020-12-25

## 2019-12-26 MED ORDER — OMEPRAZOLE 20 MG CAPSULE,DELAYED RELEASE
ORAL_CAPSULE | Freq: Every day | ORAL | 3 refills | 90 days | Status: CP | PRN
Start: 2019-12-26 — End: 2020-12-25

## 2019-12-26 MED ORDER — LEVOTHYROXINE 112 MCG TABLET
ORAL_TABLET | Freq: Every day | ORAL | 5 refills | 60 days | Status: CP
Start: 2019-12-26 — End: 2020-12-25

## 2019-12-26 MED ORDER — IPRATROPIUM 0.5 MG-ALBUTEROL 3 MG (2.5 MG BASE)/3 ML NEBULIZATION SOLN
Freq: Four times a day (QID) | RESPIRATORY_TRACT | 0 refills | 7.00000 days | Status: CP | PRN
Start: 2019-12-26 — End: 2020-01-02

## 2019-12-26 NOTE — Unmapped (Signed)
Tony Perkins    ASSESSMENT/PLAN:    Problem List Items Addressed This Visit        Digestive    Gastroesophageal reflux disease with esophagitis    Relevant Medications    omeprazole (PRILOSEC) 20 MG capsule       Endocrine    Hypothyroidism (acquired)    Relevant Medications    levothyroxine (SYNTHROID) 112 MCG tablet    Other Relevant Orders    TSH (Completed)       Other    Essential hypertension    Relevant Medications    lisinopriL (PRINIVIL,ZESTRIL) 10 MG tablet    carvediloL (COREG) 12.5 MG tablet    Hyperlipidemia    Relevant Medications    ezetimibe (ZETIA) 10 mg tablet      Other Visit Diagnoses     Cough    -  Primary    Relevant Medications    predniSONE (DELTASONE) 20 MG tablet    Other Relevant Orders    XR Chest 2 views (Completed)    Respiratory Pathogen Panel with COVID-19 (Completed)    Lower Respiratory Culture (Completed)    CBC w/ Differential (Completed)    Comprehensive Metabolic Panel (Completed)    C-reactive protein (Completed)    Ambulatory referral to Pulmonology    Shortness of breath        Relevant Medications    predniSONE (DELTASONE) 20 MG tablet    ipratropium-albuteroL (DUO-NEB) 0.5-2.5 mg/3 mL nebulizer    Other Relevant Orders    XR Chest 2 views (Completed)    Respiratory Pathogen Panel with COVID-19 (Completed)    Lower Respiratory Culture (Completed)    CBC w/ Differential (Completed)    Comprehensive Metabolic Panel (Completed)    C-reactive protein (Completed)    Ambulatory referral to Pulmonology    Sore throat        Coronary artery disease involving native coronary artery of native heart without angina pectoris        Relevant Medications    lisinopriL (PRINIVIL,ZESTRIL) 10 MG tablet    ezetimibe (ZETIA) 10 mg tablet    carvediloL (COREG) 12.5 MG tablet    Fatigue, unspecified type        Relevant Orders    TSH (Completed)    Pulmonary sarcoidosis (CMS-HCC)        Relevant Medications    ipratropium-albuteroL (DUO-NEB) 0.5-2.5 mg/3 mL nebulizer    Other Relevant Orders    Ambulatory referral to Pulmonology      Concern for pneumonia vs. bronchitis vs. pulmonary sarcoidosis with acute flare.  Check CXR, labs, RPP, sputum culture.   Will hold off on further antibiotics until testing complete, as patient has had two courses in the last 6 weeks.  Start steroid burst and Duo-Nebs.  Will refer to pulmonology for PFTs and further evaluation of sarcoid.   Discussed red flag symptoms and when to seek emergency care.  RTC for appointment with PCP in 10-14 days.    Chief Complaint   Patient presents with   ??? Sore Throat     sore, cough, congestion         SUBJECTIVE:    Tony Perkins is a 49 y.o. male that presents to Urgent Care  today regarding the following issues:    #cough  First got sick on July 1 with sore throat, congestion, low grade fevers, and cough. Had COVID test on July 5, which was negative. Was started on  Augmentin after developing wheezing and worsening cough. Felt better around Aug. 16. Then on Aug. 16 started with cough, body aches and nasal congestion. He also reports night sweats.   Last Friday, had another COVID test done, which was negative. Was given a Z-Pack at urgent care.  Now having intense episodes of fatigue. SOB with minimal activity and wheezing. Had a near syncopal episode last week. Has had chest pain episodes.   Fully vaccinated for COVID since April.  PMH of CAD with MI in 2015 and pulmonary sarcoidosis. Has not been seen by pulmonology in 2+ years. Was recently evaluated by cardiology and all testing was normal.     #refills  Patient missed his appointment with his PCP and needs refills on his chronic medications.    I have reviewed the patients problem list, medical history, surgical history, laboratory history and recent hospitalizations, current medications, allergies, and social history and updated them as needed.    Review of symptoms:  Negative unless otherwise stated in HPI.     Tony Perkins  reports that he quit smoking about 27 years ago. His smoking use included cigarettes. He quit after 0.50 years of use. He has never used smokeless tobacco.    OBJECTIVE:    VITALS:   Vitals:    12/26/19 0800   BP: 176/98   Pulse: 98   Temp: 37.1 ??C (98.7 ??F)    Wt:   Wt Readings from Last 3 Encounters:   12/26/19 96.6 kg (213 lb)   12/19/19 97.5 kg (215 lb)   11/02/19 92.8 kg (204 lb 8 oz)       Gen: Pleasant and cooperative in NAD, resting comfortably, appears stated age  Head: Normocephalic, atraumatic  EENT: sclera clear, nares patent without discharge, turbinates pink without hypertrophy, TM's with normal landmarks and without effusion, no injection, MMM, no posterior oropharyngeal erythema, no thyromegaly, no cervical lymphadenopathy  CV: Normal S1 S2 no m/r/g  Resp: diminished at bases and scattered wheezing  Ext:  Pulses are palpable +2 in all four extremities, no swelling  Neuro:  A&O x 4, normal gait  Skin: Warm and dry, no obvious rash or suspicious lesions present  Psych:  Mood is good, able to carry on normal conversation, with good eye contact, without evidence of anxiety or depression    LABS:  Results for orders placed or performed in visit on 12/26/19   Respiratory Pathogen Panel with COVID-19   Result Value Ref Range    Adenovirus Not Detected Not Detected    Coronavirus HKU1 Not Detected Not Detected    Coronavirus NL63 Not Detected Not Detected    Coronavirus 229E Not Detected Not Detected    Coronavirus OC43 PCR Not Detected Not Detected    Metapneumovirus Not Detected Not Detected    Rhinovirus/Enterovirus Not Detected Not Detected    Influenza A Not Detected Not Detected    Influenza B Not Detected Not Detected    Parainfluenza 1 Not Detected Not Detected    Parainfluenza 2 Not Detected Not Detected    Parainfluenza 3 Not Detected Not Detected    Parainfluenza 4 Not Detected Not Detected    RSV Not Detected Not Detected    Bordetella pertussis Not Detected Not Detected    Bordetella parapertussis Not Detected Not Detected    Chlamydophila (Chlamydia) pneumoniae Not Detected Not Detected    Mycoplasma pneumoniae Not Detected Not Detected    SARS-CoV-2 PCR Not Detected Not Detected   Lower Respiratory Culture  Specimen: SPUTUM EXPECTORATED   Result Value Ref Range    Lower Respiratory Culture 4+ Haemophilus influenzae (A)     Lower Respiratory Culture 4+ Oropharyngeal Flora Isolated     Gram Stain <10  Epithelial cells/LPF     Gram Stain >25 PMNS/LPF     Gram Stain Smear Results Suggest Mixed oral flora     Gram Stain Acceptable for culture    C-reactive protein   Result Value Ref Range    CRP <4.0 <=10.0 mg/L   Lipid Panel   Result Value Ref Range    Triglycerides 242 (H) 0 - 150 mg/dL    Cholesterol 161 (H) <=200 mg/dL    HDL 45 40 - 60 mg/dL    LDL Calculated 096 (H) 40 - 99 mg/dL    VLDL Cholesterol Cal 48.4 11 - 50 mg/dL    Chol/HDL Ratio 4.9 (H) 1.0 - 4.5    Non-HDL Cholesterol 174 (H) 70 - 130 mg/dL    FASTING No    Comprehensive Metabolic Panel   Result Value Ref Range    Sodium 136 135 - 145 mmol/L    Potassium 3.9 3.4 - 4.5 mmol/L    Chloride 104 98 - 107 mmol/L    Anion Gap 10 5 - 14 mmol/L    CO2 22.0 20.0 - 31.0 mmol/L    BUN 15 9 - 23 mg/dL    Creatinine 0.45 4.09 - 1.10 mg/dL    BUN/Creatinine Ratio 15     EGFR CKD-EPI Non-African American, Male >90 >=60 mL/min/1.89m2    EGFR CKD-EPI African American, Male >90 >=60 mL/min/1.44m2    Glucose 184 (H) 70 - 179 mg/dL    Calcium 9.1 8.7 - 81.1 mg/dL    Albumin 3.9 3.4 - 5.0 g/dL    Total Protein 7.0 5.7 - 8.2 g/dL    Total Bilirubin 0.2 (L) 0.3 - 1.2 mg/dL    AST 13 <=91 U/L    ALT 19 10 - 49 U/L    Alkaline Phosphatase 84 46 - 116 U/L   TSH   Result Value Ref Range    TSH 2.895 0.550 - 4.780 uIU/mL   CBC w/ Differential   Result Value Ref Range    WBC 9.1 4.5 - 11.0 10*9/L    RBC 4.85 4.50 - 5.90 10*12/L    HGB 14.9 13.5 - 17.5 g/dL    HCT 47.8 29.5 - 62.1 %    MCV 89.9 80.0 - 100.0 fL    MCH 30.8 26.0 - 34.0 pg    MCHC 34.3 31.0 - 37.0 g/dL    RDW 30.8 65.7 - 84.6 %    MPV 8.5 7.0 - 10.0 fL    Platelet 375 150 - 440 10*9/L    Neutrophils % 76.1 %    Lymphocytes % 13.5 %    Monocytes % 4.0 %    Eosinophils % 4.4 %    Basophils % 0.9 %    Absolute Neutrophils 6.9 2.0 - 7.5 10*9/L    Absolute Lymphocytes 1.2 (L) 1.5 - 5.0 10*9/L    Absolute Monocytes 0.4 0.2 - 0.8 10*9/L    Absolute Eosinophils 0.4 0.0 - 0.4 10*9/L    Absolute Basophils 0.1 0.0 - 0.1 10*9/L    Large Unstained Cells 1 0 - 4 %       STUDIES:  No results found.    ___________________________________  CURRENT MEDS:  Current Outpatient Medications   Medication Sig Dispense Refill   ??? alirocumab (  PRALUENT) 150 mg/mL subcutaneous injection Inject the contents of 1 pen (150 mg total) under the skin every fourteen (14) days. 6 mL 4   ??? ascorbic acid (VITAMIN C) 1000 MG tablet Take 1,000 mg by mouth Two (2) times a day.      ??? aspirin (ECOTRIN) 81 MG tablet Take 81 mg by mouth daily.     ??? empty container Misc Use as directed to dispose of injectable medications 1 each 3   ??? ezetimibe (ZETIA) 10 mg tablet Take 1 tablet (10 mg total) by mouth daily. 90 tablet 3   ??? levothyroxine (SYNTHROID) 112 MCG tablet Take 1 tablet (112 mcg total) by mouth daily. 60 tablet 5   ??? lisinopriL (PRINIVIL,ZESTRIL) 10 MG tablet Take 1 tablet (10 mg total) by mouth two (2) times a day. 180 tablet 3   ??? omeprazole (PRILOSEC) 20 MG capsule Take 1 capsule (20 mg total) by mouth daily as needed. 90 capsule 3   ??? syringe with needle (TERUMO SYRINGE) 3 mL 23 gauge x 1 1/2 Syrg 1 Syringe by Miscellaneous route once a week. 25 each 1   ??? tadalafiL (CIALIS) 5 MG tablet Take 1 tablet (5 mg total) by mouth daily. 90 tablet 3   ??? testosterone cypionate (DEPOTESTOTERONE CYPIONATE) 200 mg/mL injection Inject 0.6 mL (120 mg total) into the muscle once a week. Single use Vials 4 mL 5   ??? amoxicillin-clavulanate (AUGMENTIN) 875-125 mg per tablet Take 1 tablet by mouth every twelve (12) hours for 7 days. 14 tablet 0   ??? carvediloL (COREG) 12.5 MG tablet Take 1 tablet (12.5 mg total) by mouth Two (2) times a day. 180 tablet 3   ??? ipratropium-albuteroL (DUO-NEB) 0.5-2.5 mg/3 mL nebulizer Inhale 3 mL by nebulization every six (6) hours as needed (shortness of breath and wheezing) for up to 7 days. 84 mL 0   ??? predniSONE (DELTASONE) 20 MG tablet Take 2 tablets (40 mg total) by mouth Take as directed for 11 days. Take 2 tablets in the morning for 7 days, then 1 tablet daily x 2 days, then 1/2 tablet daily for 2 days. 17 tablet 0     No current facility-administered medications for this visit.       ___________________________________  ALLERGIES:  Allergies   Allergen Reactions   ??? Codeine Nausea And Vomiting   ??? Dilaudid [Hydromorphone] Nausea And Vomiting     ------------------------    PAST MEDICAL HISTORY:   Past Medical History:   Diagnosis Date   ??? Coronary artery disease     MI July 23, 2013   ??? Diabetes mellitus (CMS-HCC)    ??? Disease of thyroid gland    ??? Erectile dysfunction associated with type 2 diabetes mellitus (CMS-HCC)    ??? Gastroesophageal reflux    ??? Hyperlipidemia    ??? Hypertension    ??? Hypogonadism male    ??? Hypothyroidism    ??? Obesity (BMI 30-39.9)    ??? Sarcoidosis of lung (CMS-HCC)          FMURGENTCARETRACKING       Did today's visit save an ED/Direct Admission?  no    PHQ-2 Score:  PHQ-2 Total Score : 0       Screening complete, no depression identified / no further action needed today    I personally spent 38 minutes face-to-face and non-face-to-face in the care of this patient, which includes all pre, intra, and post visit time on the date of service.  [facility]  [place] [facility] [place] [facility]  [address] [place], [place] [address] ??? Telephone [phone] ??? Fax [phone]  [email or web address]

## 2019-12-28 DIAGNOSIS — J14 Pneumonia due to Hemophilus influenzae: Principal | ICD-10-CM

## 2019-12-28 MED ORDER — AMOXICILLIN 875 MG-POTASSIUM CLAVULANATE 125 MG TABLET
ORAL_TABLET | Freq: Two times a day (BID) | ORAL | 0 refills | 7 days | Status: CP
Start: 2019-12-28 — End: 2020-01-04

## 2019-12-28 NOTE — Unmapped (Signed)
Called patient with results. Respiratory culture grew Haemophilus influenzae. Since he is having continued symptoms, with SOB, cough, and night sweats, will start treatment with Augmentin. Continue prednisone. Discussed supportive care measures and when to seek emergency care.

## 2019-12-30 ENCOUNTER — Ambulatory Visit: Admit: 2019-12-30 | Discharge: 2019-12-31 | Payer: BLUE CROSS/BLUE SHIELD

## 2019-12-30 DIAGNOSIS — D869 Sarcoidosis, unspecified: Principal | ICD-10-CM

## 2020-01-09 ENCOUNTER — Telehealth: Payer: Self-pay | Admitting: Podiatry

## 2020-01-09 ENCOUNTER — Encounter: Admit: 2020-01-09 | Discharge: 2020-01-10 | Payer: BLUE CROSS/BLUE SHIELD

## 2020-01-09 DIAGNOSIS — R05 Cough: Principal | ICD-10-CM

## 2020-01-09 DIAGNOSIS — D860 Sarcoidosis of lung: Principal | ICD-10-CM

## 2020-01-09 DIAGNOSIS — R0602 Shortness of breath: Principal | ICD-10-CM

## 2020-01-09 DIAGNOSIS — G4733 Obstructive sleep apnea (adult) (pediatric): Principal | ICD-10-CM

## 2020-01-09 NOTE — Unmapped (Unsigned)
[facility] Pharmacist Visit     Tony Perkins is a 49 y.o. male being seen by Dr. Regino Schultze for a new patient visit.    Chief Complaint/Reason for referral: shortness of breath, cough    OUTPATIENT MEDICATION LIST     Current Outpatient Medications on File Prior to Visit    Medication Sig    ??? Albuterol HFA     ??? alirocumab (PRALUENT) 150 mg/mL subcutaneous injection Inject the contents of 1 pen (150 mg total) under the skin every fourteen (14) days.    ??? [EXPIRED] amoxicillin-clavulanate (AUGMENTIN) 875-125 mg per tablet Take 1 tablet by mouth every twelve (12) hours for 7 days. Not Taking - x7 day course completed   ??? ascorbic acid (VITAMIN C) 1000 MG tablet Take 1,000 mg by mouth Two (2) times a day.     ??? aspirin (ECOTRIN) 81 MG tablet Take 81 mg by mouth daily.    ??? carvediloL (COREG) 12.5 MG tablet Take 1 tablet (12.5 mg total) by mouth Two (2) times a day.    ??? empty container Misc Use as directed to dispose of injectable medications    ??? ezetimibe (ZETIA) 10 mg tablet Take 1 tablet (10 mg total) by mouth daily.    ??? ipratropium-albuteroL (DUO-NEB) 0.5-2.5 mg/3 mL nebulizer Inhale 3 mL by nebulization every six (6) hours as needed (shortness of breath and wheezing) for up to 7 days. PRN. Hasn't used in the last few days   ??? levothyroxine (SYNTHROID) 112 MCG tablet Take 1 tablet (112 mcg total) by mouth daily.    ??? lisinopriL (PRINIVIL,ZESTRIL) 10 MG tablet Take 1 tablet (10 mg total) by mouth two (2) times a day.    ??? omeprazole (PRILOSEC) 20 MG capsule Take 1 capsule (20 mg total) by mouth daily as needed. Taking   ??? [EXPIRED] predniSONE (DELTASONE) 20 MG tablet Take 2 tablets (40 mg total) by mouth Take as directed for 11 days. Take 2 tablets in the morning for 7 days, then 1 tablet daily x 2 days, then 1/2 tablet daily for 2 days. Not Taking - x7 day course completed   ??? syringe with needle (TERUMO SYRINGE) 3 mL 23 gauge x 1 1/2 Syrg 1 Syringe by Miscellaneous route once a week.    ??? tadalafiL (CIALIS) 5 MG tablet Take 1 tablet (5 mg total) by mouth daily.    ??? testosterone cypionate (DEPOTESTOTERONE CYPIONATE) 200 mg/mL injection Inject 0.6 mL (120 mg total) into the muscle once a week. Single use Vials Weekly (Saturdays)     PULMONARY MEDICATIONS     Current pulmonary medications:   ??? Duoneb - Newly prescribed when sick with pneumonia. Used PRN for a few days  ??? Albuterol - Newly prescribed when sick with pneumonia. Used PRN for a few days    Other considerations:  ??? Antibiotic use:   o 12/2019 - augmentin x7 days  o 11/2019 - azithromycin x5 days  o 10/2019 - augmentin x10 days (after urology surgery)  ??? Oral steroid use:  o 12/2019 - prednisone x7 day course     MEDICATION MANAGEMENT   ??? Medications Management: Patient manages own medications  ??? Adherence: Excellent  ??? Access: No issues related to access   ??? Preferred Pharmacy: [facility] in [place]    I spent a total of 10 minutes face to face with the patient delivering clinical care and providing education/counseling. Medications reviewed in EPIC medication station and updated today by the  clinical pharmacist practitioner.     Recommendations and medication-related problems were discussed directly with pulmonologist, Tai Neill Loft, MD.    Electronically signed:  Barbette Hair, PharmD, MPH, BCPS, CPP  Clinical Pharmacist Practitioner  [facility]  [phone]    I am located on-site and the patient is located on-site for this visit.

## 2020-01-09 NOTE — Unmapped (Signed)
Pulmonary Clinic - Initial Visit    Referring Physician :  Theda Belfast  PCP:     Rolm Bookbinder, MD  Reason for Consult:   Dyspnea, sarocidosis  -     HISTORY:     History of Present Illness:  Tony Perkins is a 49 y.o. male with a history of CAD, DM (diet controlled), hypothyroidism, HTN, HLD, obesity, sarcoidosis, erectile dysfunction s/p recent surgery whom we are seeing in consultation requested by Theda Belfast for evaluation of Sarcoidosis.    #Cough, dyspnea  #Sarcoidosis  - persistent cough, especially in the morning, since July 1st, mostly dry   - on lisinopril, trial off it did not work, known mild cough   - chronic sore throat, flu like symptoms body ache, chest burning, congestion   - no weight loss, appetite endorse fever (100.3), night sweats, chills   - cough and SOB with exertion - gets tight in the chest, cough and wheezing   - can walk on flat ground ok, will struggle to walk up a flight of stairs   - no swelling   - sometimes coughing fits w/ eating or drinking, possible occasional aspiration   - denies dysphagia, odynophagia  - PFT in 12/30/19 normal spirometry, lung volumes, and DLCO   Obesity related decrease in FRC and ERV  - intermittent cough, low grade fever, sore throat, congestion since July   - got a course of Augmentin, then symptoms recurred   - then got a course of azithro from urgent care  - fatigue, DOE minimal activity, wheezing  - also has near syncopal episodes, CP episodes  - s/p COVID vaccine April     #congestion  - endorse congestion  - occasional seasonal allergies    #OA  - hands, feet, and back  - currently only takes tylenol and ibuprofen (2-3 a day 400-600mg  a daily)    #GERD  - takes omeprazole 20mg  daily, well controlled    #Pulmonary sarcoidosis  - outside diagnosis  - not seen by pulm for 2+ years  - last seen in 2015  - first referred in 2013 for mediastinal and hilar adenopathy  - bronchoscopy on 10/09/2011 which demonstrated granulomatous inflammation on one of the sampled LNs  - seen by pulm 02/09/2012 at which point he was complaining of non-specific constitutional symptoms which at the time were felt to be 2/2 sarcoidosis. He was to begin treatment with methotrexate. He was subsequently evaluated by rheumatology clinic on 03/24/2012 at which point his joint-related symptoms were likely 2/2 OA and not sarcoidosis. He only took 6 does of methotrexate and stopped and seeing rheumatology. He remained symptomatic but we never reinitiated treatment as his PFTs and X-ray remained normal.  - augmentin july, azithro aug 1 week, augmentin and pred 7 day sept last finished yesterday  - recently got duonebs and albuterol inhaler - now off for a few days    #CAD  - CAD with MI in 2015   -  evaluated by cardiology and all testing was normal  - TTE 2019 normal  - takes coreg, lisinopril, ASA, and statin    #OSA  - previously on CPAP machine - years ago (2013) have not used in 2017  - insurance did not approve sleep study - needs home sleep study vs in clinic    Past Medical History:    Past Surgical History:   Procedure Laterality Date   ??? ABDOMINAL SURGERY     ??? BACK SURGERY  N/A    ??? CHOLECYSTECTOMY     ??? CORONARY ANGIOPLASTY WITH STENT PLACEMENT  July 23, 2013   ??? PR CATH PLACE/CORON ANGIO, IMG SUPER/INTERP,W LEFT HEART VENTRICULOGRAPHY N/A 01/17/2015    Procedure: Left Heart Catheterization;  Surgeon: Marlaine Hind, MD;  Location: [facility] CATH;  Service: Cardiology   ??? PR CATH PLACE/CORON ANGIO, IMG SUPER/INTERP,W LEFT HEART VENTRICULOGRAPHY N/A 02/12/2018    Procedure: Left Heart Catheterization;  Surgeon: Rosana Hoes, MD;  Location: [facility] CATH;  Service: Cardiology   ??? PR EXC SKIN BENIG <0.5 CM REMAINDER BODY N/A 05/25/2017    Procedure: EXCISION, BENIGN LESION INCLUDING MARGINS, GENITALIA; EXCISED DIAMETER 0.5 CM OR LESS;  Surgeon: Lazarus Gowda, MD;  Location: [facility] OR [facility];  Service: Urology   ??? PR IMPLANT SPINAL NEUROSTIM/RECEIVER Midline 04/15/2013 Procedure: INSERTION OR REPLACEMENT SPINAL NEUROSTIMULATOR PULSE GENERATOR OR RECEIVER, DIRECT OR INDUCTIVE COUPLING;  Surgeon: Marygrace Drought, MD;  Location: MAIN OR [facility];  Service: Anesthesiology   ??? PR INSERT,INFLATABLE PENILE PROSTHESIS N/A 05/06/2019    Procedure: R14 Insertion of inflatable penile prosthesis;  Surgeon: Turner Daniels, MD;  Location: [facility] OR [facility];  Service: Urology   ??? PR PERCUT IMPLNT NEUROELECT,EPIDURAL Midline 04/15/2013    Procedure: PERCUTANEOUS IMPLANTATION OF NEUROSTIMULATOR ELECTRODE ARRAY; EPIDURAL;  Surgeon: Marygrace Drought, MD;  Location: MAIN OR [facility];  Service: Anesthesiology   ??? PR RECONSTRUC ANT MALE URETHRA N/A 12/01/2017    Procedure: URETHROPLASTY, ONE-STAGE RECONSTRUCTION OF MALE ANTERIOR URETHRA;  Surgeon: Turner Daniels, MD;  [facility] OR [facility];  Service: Urology   ??? PR REPAIR,INFLATABLE PENILE PROSTHESIS N/A 11/02/2019    Procedure: REPAIR OF COMPONENT(S) OF A MULTI-COMPONENT, INFLATABLE PENILE PROSTH;  Surgeon: Turner Daniels, MD;  Location: [facility] OR [facility];  Service: Urology   ??? SPINAL FUSION     ??? SPINE SURGERY     ??? TONSILLECTOMY N/A        Other History:  The social history and family history were personally reviewed and updated in the patient's electronic medical record.  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 Stroke Mother    ??? Heart disease Father    ??? Anesthesia problems Neg Hx    ??? Bleeding Disorder Neg Hx        Home Medications:  Current Outpatient Medications on File Prior to Visit   Medication Sig Dispense Refill   ??? alirocumab (PRALUENT) 150 mg/mL subcutaneous injection Inject the contents of 1 pen (150 mg total) under the skin every fourteen (14) days. 6 mL 4   ??? [EXPIRED] amoxicillin-clavulanate (AUGMENTIN) 875-125 mg per tablet Take 1 tablet by mouth every twelve (12) hours for 7 days. 14 tablet 0   ??? ascorbic acid (VITAMIN C) 1000 MG tablet Take 1,000 mg by mouth Two (2) times a day.      ??? aspirin (ECOTRIN) 81 MG tablet Take 81 mg by mouth daily.     ??? carvediloL (COREG) 12.5 MG tablet Take 1 tablet (12.5 mg total) by mouth Two (2) times a day. 180 tablet 3   ??? empty container Misc Use as directed to dispose of injectable medications 1 each 3   ??? ezetimibe (ZETIA) 10 mg tablet Take 1 tablet (10 mg total) by mouth daily. 90 tablet 3   ??? ipratropium-albuteroL (DUO-NEB) 0.5-2.5  mg/3 mL nebulizer Inhale 3 mL by nebulization every six (6) hours as needed (shortness of breath and wheezing) for up to 7 days. 84 mL 0   ??? levothyroxine (SYNTHROID) 112 MCG tablet Take 1 tablet (112 mcg total) by mouth daily. 60 tablet 5   ??? lisinopriL (PRINIVIL,ZESTRIL) 10 MG tablet Take 1 tablet (10 mg total) by mouth two (2) times a day. 180 tablet 3   ??? omeprazole (PRILOSEC) 20 MG capsule Take 1 capsule (20 mg total) by mouth daily as needed. 90 capsule 3   ??? [EXPIRED] predniSONE (DELTASONE) 20 MG tablet Take 2 tablets (40 mg total) by mouth Take as directed for 11 days. Take 2 tablets in the morning for 7 days, then 1 tablet daily x 2 days, then 1/2 tablet daily for 2 days. 17 tablet 0   ??? syringe with needle (TERUMO SYRINGE) 3 mL 23 gauge x 1 1/2 Syrg 1 Syringe by Miscellaneous route once a week. 25 each 1   ??? tadalafiL (CIALIS) 5 MG tablet Take 1 tablet (5 mg total) by mouth daily. 90 tablet 3   ??? testosterone cypionate (DEPOTESTOTERONE CYPIONATE) 200 mg/mL injection Inject 0.6 mL (120 mg total) into the muscle once a week. Single use Vials 4 mL 5     No current facility-administered medications on file prior to visit.       Allergies:  Allergies as of 01/09/2020 - Reviewed 12/26/2019   Allergen Reaction Noted   ??? Codeine Nausea And Vomiting 08/16/2012   ??? Dilaudid [hydromorphone] Nausea And Vomiting 05/06/2019       Review of Systems:  A comprehensive review of systems was completed and negative except as noted in HPI.    PHYSICAL EXAM:     Vitals:    01/09/20 1409   BP: 137/82   BP Site: L Arm   BP Position: Sitting   BP Cuff Size: Medium   Pulse: 85   Temp: 36.7 ??C   TempSrc: Temporal   SpO2: 99%   Weight: 96.6 kg (213 lb)     General: Alert and oriented, no acute distress  HEENT: MMM, clear oropharynx  CV: RRR, no m/r/g  Lungs: CTAB, no increased work of breathing  Abd: Soft, NT, ND, no rebound or guarding  Ext: Warm, well perfused, no peripheral edema  Skin: No rashes  Neuro: No focal deficits    LABORATORY and RADIOLOGY DATA:     Pulmonary Function Tests/Interpretation:  Spirometry:  - PFT in 12/30/19 normal spirometry, lung volumes, and DLCO   Obesity related decrease in FRC and ERV    Pertinent Laboratory Data:  N/a    Pertinent Imaging Data:  CXR 12/26/19  - no cardiopulmonary disease      ASSESSMENT and PLAN     Tony Perkins is a 49 y.o. male with CAD, DM (diet controlled), hypothyroidism, HTN, HLD, obesity, sarcoidosis, erectile dysfunction s/p recent surgery whom we are seeing in consultation requested by Theda Belfast for evaluation of Sarcoidosis.    #Cough, dyspnea  #Sarcoidosis  - overall PFT and CXR and history not suggestive of ongoing sarcoidosis  - history of possible aspiration  - recent culture w/ oropharyngeal flora, also multiple abx courses  - CT chest to further evaluate  - some viral symptoms, multiple viral panels and COVID tests negative  - encourage flonase use for nasal congestion  - PFT     #OA  - hands, feet, and back  - currently only takes tylenol and ibuprofen (  2-3 a day 400-600mg  a daily)  - would discourage daily NSAID use    #GERD  - takes omeprazole 20mg  daily, well controlled, continue    #CAD  - CAD with MI in 2015   -  evaluated by cardiology and all testing was normal  - TTE 2019 normal  - takes coreg, lisinopril, ASA, and statin  - will repeat TTE to monitor    #OSA  - previously on CPAP machine - years ago (2013) have not used in 2017  - insurance did not approve sleep study - needs home sleep study   - home sleep study ordered today, will work with CM/SW to see if insurance will cover      Plan of care was discussed with the patient who acknowledged understanding and is in agreement.    Patient will return to clinic in 6 weeks or sooner if needed.    This patient was seen and discussed with attending physician, Dr. Kearney Hard, who agrees with the assessment and plan above.     GN:[id number] Jerilynn Som, Rolm Bookbinder, MD    Rosary Lively, MD  Pulmonary and Critical Care Fellow

## 2020-01-09 NOTE — Telephone Encounter (Signed)
Pt called checking status of brace that was ordered.  I explained that the insurance never sent me approval but I called and got the information but it took longer and the brace is ordered and I will call when it comes in.

## 2020-01-10 NOTE — Unmapped (Signed)
Addended by: Morene Antu on: 01/10/2020 01:11 PM     Modules accepted: Level of Service

## 2020-02-03 ENCOUNTER — Emergency Department (HOSPITAL_COMMUNITY): Payer: No Typology Code available for payment source

## 2020-02-03 ENCOUNTER — Encounter (HOSPITAL_COMMUNITY): Payer: Self-pay

## 2020-02-03 ENCOUNTER — Other Ambulatory Visit: Payer: Self-pay

## 2020-02-03 ENCOUNTER — Emergency Department (HOSPITAL_COMMUNITY)
Admission: EM | Admit: 2020-02-03 | Discharge: 2020-02-03 | Disposition: A | Discharge status: Home / Self Care | Payer: No Typology Code available for payment source | Attending: Emergency Medicine | Admitting: Emergency Medicine

## 2020-02-03 DIAGNOSIS — W1789XA Other fall from one level to another, initial encounter: Secondary | ICD-10-CM | POA: Insufficient documentation

## 2020-02-03 DIAGNOSIS — Z79899 Other long term (current) drug therapy: Secondary | ICD-10-CM | POA: Diagnosis not present

## 2020-02-03 DIAGNOSIS — S92002A Unspecified fracture of left calcaneus, initial encounter for closed fracture: Secondary | ICD-10-CM | POA: Insufficient documentation

## 2020-02-03 DIAGNOSIS — I2510 Atherosclerotic heart disease of native coronary artery without angina pectoris: Secondary | ICD-10-CM | POA: Diagnosis not present

## 2020-02-03 DIAGNOSIS — Y92812 Truck as the place of occurrence of the external cause: Secondary | ICD-10-CM | POA: Insufficient documentation

## 2020-02-03 DIAGNOSIS — S99922A Unspecified injury of left foot, initial encounter: Secondary | ICD-10-CM | POA: Diagnosis present

## 2020-02-03 DIAGNOSIS — I10 Essential (primary) hypertension: Secondary | ICD-10-CM | POA: Diagnosis not present

## 2020-02-03 DIAGNOSIS — W19XXXA Unspecified fall, initial encounter: Secondary | ICD-10-CM

## 2020-02-03 DIAGNOSIS — Z23 Encounter for immunization: Secondary | ICD-10-CM | POA: Insufficient documentation

## 2020-02-03 DIAGNOSIS — E039 Hypothyroidism, unspecified: Secondary | ICD-10-CM | POA: Diagnosis not present

## 2020-02-03 MED ORDER — ONDANSETRON 4 MG PO TBDP: 4.0000 mg | ORAL_TABLET | Freq: Three times a day (TID) | ORAL | 0 refills | Status: AC | PRN

## 2020-02-03 MED ORDER — ONDANSETRON 4 MG PO TBDP
4.0000 mg | ORAL_TABLET | Freq: Once | ORAL | Status: DC
  Filled 2020-02-03: qty 1

## 2020-02-03 MED ORDER — ONDANSETRON 4 MG PO TBDP
4.0000 mg | ORAL_TABLET | Freq: Once | ORAL | Status: AC
  Administered 2020-02-03: 4 mg via ORAL
  Filled 2020-02-03: qty 1

## 2020-02-03 MED ORDER — HYDROCODONE-ACETAMINOPHEN 5-325 MG PO TABS: 1.0000 | ORAL_TABLET | ORAL | 0 refills | Status: AC | PRN

## 2020-02-03 MED ORDER — TETANUS-DIPHTH-ACELL PERTUSSIS 5-2.5-18.5 LF-MCG/0.5 IM SUSP
0.5000 mL | Freq: Once | INTRAMUSCULAR | Status: AC
  Administered 2020-02-03: 0.5 mL via INTRAMUSCULAR
  Filled 2020-02-03: qty 0.5

## 2020-02-03 MED ORDER — HYDROMORPHONE HCL 1 MG/ML IJ SOLN
0.5000 mg | Freq: Once | INTRAMUSCULAR | Status: AC
  Administered 2020-02-03: 0.5 mg via INTRAMUSCULAR
  Filled 2020-02-03: qty 1

## 2020-02-03 MED ORDER — HYDROMORPHONE HCL 1 MG/ML IJ SOLN: 1.0000 mg | Freq: Once | INTRAMUSCULAR | Status: DC

## 2020-02-03 MED ORDER — OXYCODONE-ACETAMINOPHEN 5-325 MG PO TABS
1.0000 | ORAL_TABLET | Freq: Once | ORAL | Status: AC
  Administered 2020-02-03: 1 via ORAL
  Filled 2020-02-03: qty 1
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
## 2020-02-03 NOTE — ED Notes (Signed)
Pt reports that he is waiting for a c-t scan

## 2020-02-03 NOTE — ED Notes (Signed)
Pt waiting for c-t scan  Not showling  That' its ordered  The pa assures me that it is ordered

## 2020-02-03 NOTE — ED Notes (Signed)
The pt left wiothout staying for discharge papers unknown time he left

## 2020-02-03 NOTE — ED Notes (Signed)
Order for zofran still showing maybe was given but nor signed off ??? Off going rn left already

## 2020-02-03 NOTE — ED Notes (Signed)
Patient transported to X-ray 

## 2020-02-03 NOTE — ED Triage Notes (Signed)
Pt arrives to ED w/ c/o fall. 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 Pt denies loc, did not hit head. Pt AOx4, neuro intact. Pt reports 10/10 L foot, ankle, leg pain.

## 2020-02-03 NOTE — ED Notes (Signed)
Unable to give zofran not in the pyxis

## 2020-02-03 NOTE — Discharge Instructions (Signed)
Follow up with Dr. Samuella Cota. I would call Monday to schedule an appointment.  Make sure to elevate your leg.  Do not bear weight on your leg  Do not get your splint wet  I have written you for pain medication and nausea medicine. Take as needed. Do not drive or operate heavy machinery while taking this medication.  If your toes turn colors your leg becomes pale, cool to the touch, numb please seek emergent reevaluation in the emergency department

## 2020-02-06 ENCOUNTER — Telehealth: Payer: Self-pay | Admitting: Podiatry

## 2020-02-06 MED ORDER — OXYCODONE-ACETAMINOPHEN 5-325 MG PO TABS: 1.0000 | ORAL_TABLET | ORAL | 0 refills | Status: AC | PRN

## 2020-02-06 NOTE — Unmapped (Unsigned)
The [facility] has made a second and final attempt to reach this patient to refill the following medication:Praluent  We have left voicemails on the following phone numbers: [phone].    Dates contacted: 01/12/20, 01/16/20  Last scheduled delivery: 12/22/19    The patient may be at risk of non-compliance with this medication. The patient should call the [facility] at [phone] (option 4) to refill medication.    Tony Perkins Celedonio Savage   [facility]

## 2020-02-06 NOTE — Telephone Encounter (Signed)
Called patient for ED follow-up  Pain uncontrolled. Increased pain rx.  Patient to f/u when approved by workman's comp  Park Liter, DPM

## 2020-02-12 DIAGNOSIS — M7989 Other specified soft tissue disorders: Secondary | ICD-10-CM | POA: Insufficient documentation

## 2020-02-20 NOTE — Unmapped (Unsigned)
Pulmonary Clinic - Follow-Up note    Referring Physician :     PCP:     Rolm Bookbinder, MD  Reason for Consult:   Dyspnea, sarocidosis  -     HISTORY:     History of Present Illness:  [name] is a 49 y.o. male with a history of CAD, DM (diet controlled), hypothyroidism, HTN, HLD, obesity, sarcoidosis, erectile dysfunction s/p recent surgery whom we are seeing in consultation requested by   for evaluation of Sarcoidosis.    #Cough, dyspnea  #Sarcoidosis  - persistent cough, especially in the morning, since July 1st, mostly dry   - on lisinopril, trial off it did not work, known mild cough   - chronic sore throat, flu like symptoms body ache, chest burning, congestion   - no weight loss, appetite endorse fever (100.3), night sweats, chills   - cough and SOB with exertion - gets tight in the chest, cough and wheezing   - can walk on flat ground ok, will struggle to walk up a flight of stairs   - no swelling   - sometimes coughing fits w/ eating or drinking, possible occasional aspiration   - denies dysphagia, odynophagia  - PFT in 12/30/19 normal spirometry, lung volumes, and DLCO   Obesity related decrease in FRC and ERV  - intermittent cough, low grade fever, sore throat, congestion since July   - got a course of Augmentin, then symptoms recurred   - then got a course of azithro from urgent care  - fatigue, DOE minimal activity, wheezing  - also has near syncopal episodes, CP episodes  - s/p COVID vaccine April     #congestion  - endorse congestion  - occasional seasonal allergies    #OA  - hands, feet, and back  - currently only takes tylenol and ibuprofen (2-3 a day 400-600mg  a daily)    #GERD  - takes omeprazole 20mg  daily, well controlled    #Pulmonary sarcoidosis  - outside diagnosis  - not seen by pulm for 2+ years  - last seen in 2015  - first referred in 2013 for mediastinal and hilar adenopathy  - bronchoscopy on 10/09/2011 which demonstrated granulomatous inflammation on one of the sampled LNs  - seen by pulm 02/09/2012 at which point he was complaining of non-specific constitutional symptoms which at the time were felt to be 2/2 sarcoidosis. He was to begin treatment with methotrexate. He was subsequently evaluated by rheumatology clinic on 03/24/2012 at which point his joint-related symptoms were likely 2/2 OA and not sarcoidosis. He only took 6 does of methotrexate and stopped and seeing rheumatology. He remained symptomatic but we never reinitiated treatment as his PFTs and X-ray remained normal.  - augmentin july, azithro aug 1 week, augmentin and pred 7 day sept last finished yesterday  - recently got duonebs and albuterol inhaler - now off for a few days    #CAD  - CAD with MI in 2015   -  evaluated by cardiology and all testing was normal  - TTE 2019 normal  - takes coreg, lisinopril, ASA, and statin    #OSA  - previously on CPAP machine - years ago (2013) have not used in 2017  - insurance did not approve sleep study - needs home sleep study vs in clinic    Past Medical History:    Past Surgical History:   Procedure Laterality Date   ??? ABDOMINAL SURGERY     ??? BACK SURGERY N/A    ???  CHOLECYSTECTOMY     ??? CORONARY ANGIOPLASTY WITH STENT PLACEMENT  July 23, 2013   ??? PR CATH PLACE/CORON ANGIO, IMG SUPER/INTERP,W LEFT HEART VENTRICULOGRAPHY N/A 01/17/2015    Procedure: Left Heart Catheterization;  Surgeon: Marlaine Hind, MD;  Location: [facility] CATH;  Service: Cardiology   ??? PR CATH PLACE/CORON ANGIO, IMG SUPER/INTERP,W LEFT HEART VENTRICULOGRAPHY N/A 02/12/2018    Procedure: Left Heart Catheterization;  Surgeon: Rosana Hoes, MD;  Location: [facility] CATH;  Service: Cardiology   ??? PR EXC SKIN BENIG <0.5 CM REMAINDER BODY N/A 05/25/2017    Procedure: EXCISION, BENIGN LESION INCLUDING MARGINS, GENITALIA; EXCISED DIAMETER 0.5 CM OR LESS;  Surgeon: Lazarus Gowda, MD;  [facility];  Service: Urology   ??? PR IMPLANT SPINAL NEUROSTIM/RECEIVER Midline 04/15/2013    Procedure: INSERTION OR REPLACEMENT SPINAL NEUROSTIMULATOR PULSE GENERATOR OR RECEIVER, DIRECT OR INDUCTIVE COUPLING;  Surgeon: Marygrace Drought, MD;  Location: MAIN OR [facility];  Service: Anesthesiology   ??? PR INSERT,INFLATABLE PENILE PROSTHESIS N/A 05/06/2019    Procedure: R14 Insertion of inflatable penile prosthesis;  Surgeon: Turner Daniels, MD;  [facility]: [facility] OR [facility];  Service: Urology   ??? PR PERCUT IMPLNT NEUROELECT,EPIDURAL Midline 04/15/2013    Procedure: PERCUTANEOUS IMPLANTATION OF NEUROSTIMULATOR ELECTRODE ARRAY; EPIDURAL;  Surgeon: Marygrace Drought, MD;  Location: MAIN OR [facility];  Service: Anesthesiology   ??? PR RECONSTRUC ANT MALE URETHRA N/A 12/01/2017    Procedure: URETHROPLASTY, ONE-STAGE RECONSTRUCTION OF MALE ANTERIOR URETHRA;  Surgeon: Turner Daniels, MD;  [facility]: [facility] OR [facility];  Service: Urology   ??? PR REPAIR,INFLATABLE PENILE PROSTHESIS N/A 11/02/2019    Procedure: REPAIR OF COMPONENT(S) OF A MULTI-COMPONENT, INFLATABLE PENILE PROSTH;  Surgeon: Turner Daniels, MD;  [facility]: [facility] OR [facility];  Service: Urology   ??? SPINAL FUSION     ??? SPINE SURGERY     ??? TONSILLECTOMY N/A        Other History:  The social history and family history were personally reviewed and updated in the patient's electronic medical record.  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 Stroke Mother    ??? Heart disease Father    ??? Anesthesia problems Neg Hx    ??? Bleeding Disorder Neg Hx        Home Medications:  Current Outpatient Medications on File Prior to Visit   Medication Sig Dispense Refill   ??? alirocumab (PRALUENT) 150 mg/mL subcutaneous injection Inject the contents of 1 pen (150 mg total) under the skin every fourteen (14) days. 6 mL 4   ??? ascorbic acid (VITAMIN C) 1000 MG tablet Take 1,000 mg by mouth Two (2) times a day.      ??? aspirin (ECOTRIN) 81 MG tablet Take 81 mg by mouth daily.     ??? carvediloL (COREG) 12.5 MG tablet Take 1 tablet (12.5 mg total) by mouth Two (2) times a day. 180 tablet 3   ??? empty container Misc Use as directed to dispose of injectable medications 1 each 3   ??? ezetimibe (ZETIA) 10 mg tablet Take 1 tablet (10 mg total) by mouth daily. 90 tablet 3   ??? ipratropium-albuteroL (DUO-NEB) 0.5-2.5 mg/3 mL nebulizer Inhale 3 mL by nebulization every six (6) hours as needed (shortness of breath and wheezing) for up to 7 days. 84 mL 0   ???  levothyroxine (SYNTHROID) 112 MCG tablet Take 1 tablet (112 mcg total) by mouth daily. 60 tablet 5   ??? lisinopriL (PRINIVIL,ZESTRIL) 10 MG tablet Take 1 tablet (10 mg total) by mouth two (2) times a day. 180 tablet 3   ??? omeprazole (PRILOSEC) 20 MG capsule Take 1 capsule (20 mg total) by mouth daily as needed. 90 capsule 3   ??? syringe with needle (TERUMO SYRINGE) 3 mL 23 gauge x 1 1/2 Syrg 1 Syringe by Miscellaneous route once a week. 25 each 1   ??? tadalafiL (CIALIS) 5 MG tablet Take 1 tablet (5 mg total) by mouth daily. 90 tablet 3   ??? testosterone cypionate (DEPOTESTOTERONE CYPIONATE) 200 mg/mL injection Inject 0.6 mL (120 mg total) into the muscle once a week. Single use Vials 4 mL 5     No current facility-administered medications on file prior to visit.       Allergies:  Allergies as of 02/22/2020 - Reviewed 12/26/2019   Allergen Reaction Noted   ??? Codeine Nausea And Vomiting 08/16/2012   ??? Dilaudid [hydromorphone] Nausea And Vomiting 05/06/2019       Review of Systems:  A comprehensive review of systems was completed and negative except as noted in HPI.    PHYSICAL EXAM:     There were no vitals filed for this visit.  General: Alert and oriented, no acute distress  HEENT: MMM, clear oropharynx  CV: RRR, no m/r/g  Lungs: CTAB, no increased work of breathing  Abd: Soft, NT, ND, no rebound or guarding  Ext: Warm, well perfused, no peripheral edema  Skin: No rashes  Neuro: No focal deficits    LABORATORY and RADIOLOGY DATA:     Pulmonary Function Tests/Interpretation:  Spirometry:  - PFT in 12/30/19 normal spirometry, lung volumes, and DLCO   Obesity related decrease in FRC and ERV    Pertinent Laboratory Data:  N/a    Pertinent Imaging Data:  CXR 12/26/19  - no cardiopulmonary disease      ASSESSMENT and PLAN     ARLANDO LEISINGER is a 49 y.o. male with CAD, DM (diet controlled), hypothyroidism, HTN, HLD, obesity, sarcoidosis, erectile dysfunction s/p recent surgery whom we are seeing in consultation requested by Theda Belfast for evaluation of Sarcoidosis.    #Cough, dyspnea  #Sarcoidosis  - overall PFT and CXR and history not suggestive of ongoing sarcoidosis  - history of possible aspiration  - recent culture w/ oropharyngeal flora, also multiple abx courses  - CT chest to further evaluate  - some viral symptoms, multiple viral panels and COVID tests negative  - encourage flonase use for nasal congestion  - PFT     #OA  - hands, feet, and back  - currently only takes tylenol and ibuprofen (2-3 a day 400-600mg  a daily)  - would discourage daily NSAID use    #GERD  - takes omeprazole 20mg  daily, well controlled, continue    #CAD  - CAD with MI in 2015   -  evaluated by cardiology and all testing was normal  - TTE 2019 normal  - takes coreg, lisinopril, ASA, and statin  - will repeat TTE to monitor    #OSA  - previously on CPAP machine - years ago (2013) have not used in 2017  - insurance did not approve sleep study - needs home sleep study   - home sleep study ordered today, will work with CM/SW to see if insurance will cover      Plan  of care was discussed with the patient who acknowledged understanding and is in agreement.    Patient will return to clinic in 6 weeks or sooner if needed.    This patient was seen and discussed with attending physician, Dr. Kearney Hard, who agrees with the assessment and plan above.     CC: , Rolm Bookbinder, MD    Rosary Lively, MD  Pulmonary and Critical Care Fellow

## 2020-03-30 DIAGNOSIS — Z4789 Encounter for other orthopedic aftercare: Secondary | ICD-10-CM | POA: Insufficient documentation

## 2020-03-30 DIAGNOSIS — L97922 Non-pressure chronic ulcer of unspecified part of left lower leg with fat layer exposed: Secondary | ICD-10-CM | POA: Insufficient documentation

## 2020-03-30 DIAGNOSIS — Z9889 Other specified postprocedural states: Secondary | ICD-10-CM | POA: Insufficient documentation

## 2020-03-30 NOTE — Unmapped (Signed)
Pulmonary Clinic - Follow-up Visit    Referring Physician :  Parke Poisson  PCP:     Rolm Bookbinder, MD  Reason for Consult:   Dyspnea, sarocidosis  -     HISTORY:     History of Present Illness:  Tony Perkins is a 49 y.o. male with a history of CAD, DM (diet controlled), hypothyroidism, HTN, HLD, obesity, sarcoidosis, traumatic left calcenus fracture s/p surgical repair in Oct 2021 whom we are following for history of sarcoidosis    #sarcoidosis, quiescent since 2014  - first referred in 2013 for mediastinal and hilar adenopathy  - bronchoscopy on 10/09/2011 which demonstrated granulomatous inflammation on one of the sampled LNs  - seen by pulm 02/09/2012 at which point he was complaining of non-specific constitutional symptoms which at the time were felt to be 2/2 sarcoidosis. He was to begin treatment with methotrexate. He was subsequently evaluated by rheumatology clinic on 03/24/2012 at which point his joint-related symptoms were likely 2/2 OA and not sarcoidosis. He only took 6 does of methotrexate and stopped and seeing rheumatology. He remained symptomatic but we never reinitiated treatment as his PFTs and X-ray remained normal.  - over the summer the patient got multiple courses of antibiotics and prednisone (augmentin july, azithro aug 1 week, augmentin and pred 7 sept)  - since last visit in Sept 2021 the patient now feels complete asymptomatic without cough or dyspnea, no antibiotics since that clinic visit  - CT chest completed today benign, PFT completed today improved    #OA  - hands, feet, and back  - currently only takes tylenol and ibuprofen (2-3 a day 400-600mg  a daily)  - better controlled now    #GERD  - takes omeprazole 20mg  daily, well controlled    #CAD  - CAD with MI in 2015   -  evaluated by cardiology and all testing was normal  - TTE 2019 normal, TTE 04/02/20 completed pending read  - takes coreg, lisinopril, ASA, and statin  -     #OSA  - previously on CPAP machine - years ago (2013) have not used in 2017  - insurance did not approve sleep study - needs home sleep study   - home sleep study ordered today, will work with CM/SW to see if insurance will cover    #congestion, controlled  - occasional seasonal allergies, not active today      Past Medical History:    Past Surgical History:   Procedure Laterality Date   ??? ABDOMINAL SURGERY     ??? BACK SURGERY N/A    ??? CHOLECYSTECTOMY     ??? CORONARY ANGIOPLASTY WITH STENT PLACEMENT  July 23, 2013   ??? PR CATH PLACE/CORON ANGIO, IMG SUPER/INTERP,W LEFT HEART VENTRICULOGRAPHY N/A 01/17/2015    Procedure: Left Heart Catheterization;  Surgeon: Marlaine Hind, MD;  Location: [facility] CATH;  Service: Cardiology   ??? PR CATH PLACE/CORON ANGIO, IMG SUPER/INTERP,W LEFT HEART VENTRICULOGRAPHY N/A 02/12/2018    Procedure: Left Heart Catheterization;  Surgeon: Rosana Hoes, MD;  Location: [facility] CATH;  Service: Cardiology   ??? PR EXC SKIN BENIG <0.5 CM REMAINDER BODY N/A 05/25/2017    Procedure: EXCISION, BENIGN LESION INCLUDING MARGINS, GENITALIA; EXCISED DIAMETER 0.5 CM OR LESS;  Surgeon: Lazarus Gowda, MD;  [facility]: [facility] OR [facility];  Service: Urology   ??? PR IMPLANT SPINAL NEUROSTIM/RECEIVER Midline 04/15/2013    Procedure: INSERTION OR REPLACEMENT SPINAL NEUROSTIMULATOR PULSE GENERATOR OR RECEIVER, DIRECT OR INDUCTIVE COUPLING;  Surgeon:  Marygrace Drought, MD;  Location: MAIN OR [facility];  Service: Anesthesiology   ??? PR INSERT,INFLATABLE PENILE PROSTHESIS N/A 05/06/2019    Procedure: R14 Insertion of inflatable penile prosthesis;  Surgeon: Turner Daniels, MD;  Location: [facility] OR [facility];  Service: Urology   ??? PR PERCUT IMPLNT NEUROELECT,EPIDURAL Midline 04/15/2013    Procedure: PERCUTANEOUS IMPLANTATION OF NEUROSTIMULATOR ELECTRODE ARRAY; EPIDURAL;  Surgeon: Marygrace Drought, MD;  Location: MAIN OR [facility];  Service: Anesthesiology   ??? PR RECONSTRUC ANT MALE URETHRA N/A 12/01/2017    Procedure: URETHROPLASTY, ONE-STAGE RECONSTRUCTION OF MALE ANTERIOR URETHRA;  Surgeon: Turner Daniels, MD;  Location: [facility] OR [facility];  Service: Urology   ??? PR REPAIR,INFLATABLE PENILE PROSTHESIS N/A 11/02/2019    Procedure: REPAIR OF COMPONENT(S) OF A MULTI-COMPONENT, INFLATABLE PENILE PROSTH;  Surgeon: Turner Daniels, MD;  Location: [facility] OR [facility];  Service: Urology   ??? SPINAL FUSION     ??? SPINE SURGERY     ??? TONSILLECTOMY N/A        Other History:  The social history and family history were personally reviewed and updated in the patient's electronic medical record.  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 Stroke Mother    ??? Heart disease Father    ??? Anesthesia problems Neg Hx    ??? Bleeding Disorder Neg Hx        Home Medications:  Current Outpatient Medications on File Prior to Visit   Medication Sig Dispense Refill   ??? alirocumab (PRALUENT) 150 mg/mL subcutaneous injection Inject the contents of 1 pen (150 mg total) under the skin every fourteen (14) days. 6 mL 4   ??? ascorbic acid (VITAMIN C) 1000 MG tablet Take 1,000 mg by mouth Two (2) times a day.      ??? aspirin (ECOTRIN) 81 MG tablet Take 81 mg by mouth daily.     ??? carvediloL (COREG) 12.5 MG tablet Take 1 tablet (12.5 mg total) by mouth Two (2) times a day. 180 tablet 3   ??? empty container Misc Use as directed to dispose of injectable medications 1 each 3   ??? ezetimibe (ZETIA) 10 mg tablet Take 1 tablet (10 mg total) by mouth daily. 90 tablet 3   ??? ipratropium-albuteroL (DUO-NEB) 0.5-2.5 mg/3 mL nebulizer Inhale 3 mL by nebulization every six (6) hours as needed (shortness of breath and wheezing) for up to 7 days. 84 mL 0   ??? levothyroxine (SYNTHROID) 112 MCG tablet Take 1 tablet (112 mcg total) by mouth daily. 60 tablet 5   ??? lisinopriL (PRINIVIL,ZESTRIL) 10 MG tablet Take 1 tablet (10 mg total) by mouth two (2) times a day. 180 tablet 3   ??? omeprazole (PRILOSEC) 20 MG capsule Take 1 capsule (20 mg total) by mouth daily as needed. 90 capsule 3   ??? syringe with needle (TERUMO SYRINGE) 3 mL 23 gauge x 1 1/2 Syrg 1 Syringe by Miscellaneous route once a week. 25 each 1   ??? tadalafiL (CIALIS) 5 MG tablet Take 1 tablet (5 mg total) by mouth daily. 90 tablet 3   ??? testosterone cypionate (DEPOTESTOTERONE CYPIONATE) 200 mg/mL injection Inject 0.6 mL (120 mg total) into the muscle once a week. Single use Vials 4 mL 5     No current facility-administered medications on file prior to visit.  Allergies:  Allergies as of 04/02/2020 - Reviewed 12/26/2019   Allergen Reaction Noted   ??? Codeine Nausea And Vomiting 08/16/2012   ??? Dilaudid [hydromorphone] Nausea And Vomiting 05/06/2019       Review of Systems:  A comprehensive review of systems was completed and negative except as noted in HPI.    PHYSICAL EXAM:     Vitals:    04/02/20 1050   BP: 121/76   BP Site: L Arm   BP Position: Sitting   BP Cuff Size: Medium   Pulse: 67   Temp: 36.7 ??C   TempSrc: Temporal   SpO2: 98%   Weight: 94.3 kg (208 lb)   Height: 168 cm (5' 6.14)     General: Alert and oriented, no acute distress  HEENT: MMM, clear oropharynx  CV: RRR, no m/r/g  Lungs: CTAB, no increased work of breathing  Abd: Soft, NT, ND, no rebound or guarding  Ext: Warm, well perfused, no peripheral edema  Skin: No rashes  Neuro: No focal deficits    LABORATORY and RADIOLOGY DATA:     Pulmonary Function Tests/Interpretation:  Spirometry:  - PFT in 12/30/19 normal spirometry, lung volumes, and DLCO   Obesity related decrease in FRC and ERV  - PFT 04/02/2020  Spirometry is normal. No evidence of obstructive ventilatory defect.   No definite improvement after inhaled bronchodilator.   Inspiratory limb of Flow-volume loop demonstrates normal forced inspiratory flows.  ?DLCO is normal.   Improved since prior test(s) on 12/30/2019      Pertinent Laboratory Data:  N/a    Pertinent Imaging Data:  CXR 12/26/19  - no cardiopulmonary disease    Immunization History Administered Date(s) Administered   ??? COVID-19 VACC,MRNA,(PFIZER)(PF)(IM) 07/04/2019, 08/01/2019   ??? COVID-19 VACCINE,MRNA(MODERNA)(PF)(IM) 04/02/2020   ??? INFLUENZA TIV (TRI) PF (IM) 01/27/2011, 02/08/2013   ??? Influenza Vaccine Quad (IIV4 PF) 82mo+ injectable 02/06/2016, 02/03/2017, 01/01/2018, 02/21/2019, 04/02/2020   ??? Influenza Virus Vaccine, unspecified formulation 02/06/2015, 02/06/2016   ??? PNEUMOCOCCAL POLYSACCHARIDE 23 08/27/2010   ??? Td (adult) unspecified formulation 08/25/2003   ??? TdaP 10/13/2008, 02/03/2020         ASSESSMENT and PLAN     Tony Perkins is a 49 y.o. male with CAD, DM (diet controlled), hypothyroidism, HTN, HLD, obesity, sarcoidosis, erectile dysfunction s/p recent surgery whom we are seeing in consultation requested by Theda Belfast for evaluation of Sarcoidosis.    #Cough, dyspnea, now resolved  #Sarcoidosis, quiescent since 2014  - CT chest 04/02/20 reviewed benign, not suggestive of ongoing sarcoidosis  - PFT 04/02/20 improved and benign, no signs of obstruction or restriction. DLCO normal  - Patient feels symptomatically well and has no pulmonary complaints today  - TTE 04/02/20 completed pending read, will f/u to r/o concomitant cardiac disease   - initially ordered due to severe dyspnea on exertion last clinic visit, now improved   - overall low suspicion for cardiac abnormality  - no additional workup indicated today    #OA of his hands, feet, and back, well controlled  - currently only takes tylenol and ibuprofen (2-3 a day 400-600mg  a daily)  - would discourage daily NSAID use    #GERD  - takes omeprazole 20mg  daily, well controlled, continue    #CAD  - CAD with MI in 2015   -  evaluated by cardiology and all testing was normal  - TTE 2019 normal  - TTE 04/02/20 completed pending read, will f/u to r/o concomitant cardiac disease   -  initially ordered due to severe dyspnea on exertion last clinic visit, now improved   - overall low suspicion for cardiac abnormality  - takes coreg, lisinopril, ASA, and statin    #OSA  - previously on CPAP machine - years ago (2013) have not used in 2017  - insurance did not approve sleep study - needs home sleep study   - home sleep study ordered today, will work with CM/SW to see if insurance will cover   - messaged our coordinator again for assistance    Immunization History   Administered Date(s) Administered   ??? COVID-19 VACC,MRNA,(PFIZER)(PF)(IM) 07/04/2019, 08/01/2019   ??? INFLUENZA TIV (TRI) PF (IM) 01/27/2011, 02/08/2013   ??? Influenza Vaccine Quad (IIV4 PF) 17mo+ injectable 02/06/2016, 02/03/2017, 01/01/2018, 02/21/2019   ??? Influenza Virus Vaccine, unspecified formulation 02/06/2015, 02/06/2016   ??? PNEUMOCOCCAL POLYSACCHARIDE 23 08/27/2010   ??? Td (adult) unspecified formulation 08/25/2003   ??? TdaP 10/13/2008, 02/03/2020         Plan of care was discussed with the patient who acknowledged understanding and is in agreement.    Patient will return to clinic in 6 weeks or sooner if needed.    This patient was seen and discussed with attending physician, Dr. Okey Regal who agrees with the assessment and plan above.     WJ:[id number] Halford Chessman, Rolm Bookbinder, MD    Rosary Lively, MD  Pulmonary and Critical Care Fellow

## 2020-04-02 ENCOUNTER — Encounter: Admit: 2020-04-02 | Discharge: 2020-04-03 | Payer: PRIVATE HEALTH INSURANCE

## 2020-04-02 ENCOUNTER — Ambulatory Visit: Admit: 2020-04-02 | Discharge: 2020-04-03 | Payer: PRIVATE HEALTH INSURANCE

## 2020-04-02 DIAGNOSIS — I2510 Atherosclerotic heart disease of native coronary artery without angina pectoris: Principal | ICD-10-CM

## 2020-04-02 DIAGNOSIS — Z9049 Acquired absence of other specified parts of digestive tract: Principal | ICD-10-CM

## 2020-04-02 DIAGNOSIS — E785 Hyperlipidemia, unspecified: Principal | ICD-10-CM

## 2020-04-02 DIAGNOSIS — Z823 Family history of stroke: Principal | ICD-10-CM

## 2020-04-02 DIAGNOSIS — Z955 Presence of coronary angioplasty implant and graft: Principal | ICD-10-CM

## 2020-04-02 DIAGNOSIS — I10 Essential (primary) hypertension: Principal | ICD-10-CM

## 2020-04-02 DIAGNOSIS — D869 Sarcoidosis, unspecified: Principal | ICD-10-CM

## 2020-04-02 DIAGNOSIS — Z7982 Long term (current) use of aspirin: Principal | ICD-10-CM

## 2020-04-02 DIAGNOSIS — M47819 Spondylosis without myelopathy or radiculopathy, site unspecified: Principal | ICD-10-CM

## 2020-04-02 DIAGNOSIS — Z87891 Personal history of nicotine dependence: Principal | ICD-10-CM

## 2020-04-02 DIAGNOSIS — D860 Sarcoidosis of lung: Principal | ICD-10-CM

## 2020-04-02 DIAGNOSIS — I252 Old myocardial infarction: Principal | ICD-10-CM

## 2020-04-02 DIAGNOSIS — Z981 Arthrodesis status: Principal | ICD-10-CM

## 2020-04-02 DIAGNOSIS — G4733 Obstructive sleep apnea (adult) (pediatric): Principal | ICD-10-CM

## 2020-04-02 DIAGNOSIS — K219 Gastro-esophageal reflux disease without esophagitis: Principal | ICD-10-CM

## 2020-04-02 DIAGNOSIS — E669 Obesity, unspecified: Principal | ICD-10-CM

## 2020-04-02 DIAGNOSIS — Z79890 Hormone replacement therapy (postmenopausal): Principal | ICD-10-CM

## 2020-04-02 DIAGNOSIS — Z79899 Other long term (current) drug therapy: Principal | ICD-10-CM

## 2020-04-02 DIAGNOSIS — E119 Type 2 diabetes mellitus without complications: Principal | ICD-10-CM

## 2020-04-02 DIAGNOSIS — Z885 Allergy status to narcotic agent status: Principal | ICD-10-CM

## 2020-04-02 DIAGNOSIS — M19072 Primary osteoarthritis, left ankle and foot: Principal | ICD-10-CM

## 2020-04-02 DIAGNOSIS — Z23 Encounter for immunization: Principal | ICD-10-CM

## 2020-04-02 DIAGNOSIS — M19071 Primary osteoarthritis, right ankle and foot: Principal | ICD-10-CM

## 2020-04-02 DIAGNOSIS — E039 Hypothyroidism, unspecified: Principal | ICD-10-CM

## 2020-04-02 NOTE — Unmapped (Signed)
PT received moderna booster in LD and flu in RD. Pt tolerated vaccines well.

## 2020-04-04 ENCOUNTER — Encounter
Admit: 2020-04-04 | Discharge: 2020-04-05 | Payer: PRIVATE HEALTH INSURANCE | Attending: Sports Medicine | Primary: Sports Medicine

## 2020-04-04 DIAGNOSIS — H938X3 Other specified disorders of ear, bilateral: Secondary | ICD-10-CM | POA: Insufficient documentation

## 2020-04-04 DIAGNOSIS — M899 Disorder of bone, unspecified: Secondary | ICD-10-CM | POA: Insufficient documentation

## 2020-04-04 DIAGNOSIS — Z1211 Encounter for screening for malignant neoplasm of colon: Principal | ICD-10-CM

## 2020-04-04 DIAGNOSIS — N529 Male erectile dysfunction, unspecified: Principal | ICD-10-CM

## 2020-04-04 DIAGNOSIS — E785 Hyperlipidemia, unspecified: Principal | ICD-10-CM

## 2020-04-04 DIAGNOSIS — E291 Testicular hypofunction: Principal | ICD-10-CM

## 2020-04-04 DIAGNOSIS — E039 Hypothyroidism, unspecified: Principal | ICD-10-CM

## 2020-04-04 DIAGNOSIS — I10 Essential (primary) hypertension: Principal | ICD-10-CM

## 2020-04-04 DIAGNOSIS — I2510 Atherosclerotic heart disease of native coronary artery without angina pectoris: Principal | ICD-10-CM

## 2020-04-04 DIAGNOSIS — K2100 Gastro-esophageal reflux disease with esophagitis, without bleeding: Principal | ICD-10-CM

## 2020-04-04 LAB — LIPID PANEL
CHOLESTEROL/HDL RATIO SCREEN: 5.7 — ABNORMAL HIGH (ref 1.0–4.5)
CHOLESTEROL: 243 mg/dL — ABNORMAL HIGH (ref <=200)
HDL CHOLESTEROL: 43 mg/dL (ref 40–60)
NON-HDL CHOLESTEROL: 200 mg/dL — ABNORMAL HIGH (ref 70–130)
TRIGLYCERIDES: 448 mg/dL — ABNORMAL HIGH (ref 0–150)

## 2020-04-04 LAB — PTH: PARATHYROID HORMONE INTACT: 57.5 pg/mL (ref 18.4–80.1)

## 2020-04-04 LAB — TSH: THYROID STIMULATING HORMONE: 6.35 u[IU]/mL — ABNORMAL HIGH (ref 0.550–4.780)

## 2020-04-04 MED ORDER — ICOSAPENT ETHYL 1 GRAM CAPSULE
ORAL_CAPSULE | Freq: Two times a day (BID) | ORAL | 3 refills | 90.00000 days | Status: CP
Start: 2020-04-04 — End: 2020-06-18

## 2020-04-04 MED ORDER — LISINOPRIL 10 MG TABLET
ORAL_TABLET | Freq: Two times a day (BID) | ORAL | 3 refills | 90.00000 days | Status: CP
Start: 2020-04-04 — End: 2021-04-04

## 2020-04-04 MED ORDER — LEVOTHYROXINE 112 MCG TABLET
ORAL_TABLET | Freq: Every day | ORAL | 5 refills | 60.00000 days | Status: CP
Start: 2020-04-04 — End: 2021-04-04

## 2020-04-04 MED ORDER — OMEPRAZOLE 20 MG CAPSULE,DELAYED RELEASE
ORAL_CAPSULE | Freq: Every day | ORAL | 3 refills | 90 days | Status: CP | PRN
Start: 2020-04-04 — End: 2021-04-04

## 2020-04-04 MED ORDER — FLUTICASONE PROPIONATE 50 MCG/ACTUATION NASAL SPRAY,SUSPENSION
Freq: Every day | NASAL | 0 refills | 0.00000 days | Status: CP
Start: 2020-04-04 — End: 2020-06-18

## 2020-04-04 MED ORDER — EZETIMIBE 10 MG TABLET
ORAL_TABLET | Freq: Every day | ORAL | 3 refills | 90 days | Status: CP
Start: 2020-04-04 — End: 2021-04-04

## 2020-04-04 MED ORDER — TADALAFIL 5 MG TABLET
ORAL_TABLET | Freq: Every day | ORAL | 3 refills | 90 days | Status: CP
Start: 2020-04-04 — End: 2021-03-30

## 2020-04-04 MED ORDER — CARVEDILOL 12.5 MG TABLET
ORAL_TABLET | Freq: Two times a day (BID) | ORAL | 3 refills | 90.00000 days | Status: SS
Start: 2020-04-04 — End: 2020-04-23

## 2020-04-05 NOTE — Unmapped (Signed)
[facility]    04/04/2020  PCP: Rolm Bookbinder, MD       Tony Perkins is a 49 y.o. male that presents to clinic today regarding the following issues:    ASSESSMENT/PLAN:     Problem List Items Addressed This Visit        Digestive    Gastroesophageal reflux disease with esophagitis    Relevant Medications    omeprazole (PRILOSEC) 20 MG capsule       Endocrine    Hypothyroidism (acquired)     ASSESSMENT:  Doing well on synthroid, has been out a couple days    PLAN:  Will check TSH and adjust med as needed    TSH a little high will recheck in 6 weeks         Relevant Medications    levothyroxine (SYNTHROID) 112 MCG tablet    Other Relevant Orders    Dexa Bone Density Skeletal    PTH (Completed)    TSH (Completed)    Testosterone, free, total    Hypogonadism male - Primary     ASSESSMENT:  Hx of hypogonadism     PLAN:  Will check testosterone level          Relevant Medications    tadalafiL (CIALIS) 5 MG tablet    Other Relevant Orders    Testosterone, free, total       Cardiovascular and Mediastinum    Essential hypertension     ASSESSMENT:  BP Readings from Last 3 Encounters:   04/04/20 141/88   04/02/20 121/76   01/09/20 137/82     Overall good control, a little higher today than usual    PLAN:  Refilled her meds         Relevant Medications    lisinopriL (PRINIVIL,ZESTRIL) 10 MG tablet    carvediloL (COREG) 12.5 MG tablet    Coronary artery disease involving native coronary artery of native heart without angina pectoris     ASSESSMENT:  Hx of CAD s/p sents on meds with good BP but with elevated lipids and hard to tolerate statins.    Starting to have some issues (gas and GERD) that he had prior to his MI    PLAN:  Refill his meds    Will message his cardiologist - might benefit from a cath         Relevant Medications    lisinopriL (PRINIVIL,ZESTRIL) 10 MG tablet    carvediloL (COREG) 12.5 MG tablet    ezetimibe (ZETIA) 10 mg tablet    icosapent ethyL 1 gram cap    Other Relevant Orders Lipid panel (non-fasting) (Completed)       Nervous and Auditory    Ear fullness, bilateral    Relevant Medications    fluticasone propionate (FLONASE) 50 mcg/actuation nasal spray       Musculoskeletal and Integument    Bone disorder     ASSESSMENT:  49 yo M with MMP that include hypogonadism, hypothyroidism who has fx bilateral calcaneous in the past 6 months.  1 seems to have a MOI that is consistent with a fx the other is so so.    I wonder if there is an underlying endocrinological basis.  Has had normal Ca++ in the past, Vit D was a little low     PLAN:  Will check a PTH and get a dexa         Relevant Orders    Dexa Bone Density Skeletal  PTH (Completed)    Testosterone, free, total       Other    Erectile dysfunction    Relevant Medications    tadalafiL (CIALIS) 5 MG tablet    Hyperlipidemia     ASSESSMENT:  Hx of hyperlipidemia    Has had issues with tolerating statins in the past     PO and injectable statins both has issues    PLAN:  Will look to get him started on some vasecpa will check about using lopid or gemfibrozil          Relevant Medications    ezetimibe (ZETIA) 10 mg tablet    icosapent ethyL 1 gram cap    Other Relevant Orders    Lipid panel (non-fasting) (Completed)      Other Visit Diagnoses     Screening for colon cancer        Relevant Orders    Colorectal Cancer DNA + FIT                Tony Perkins  reports that he quit smoking about 27 years ago. His smoking use included cigarettes. He quit after 0.50 years of use. He has never used smokeless tobacco.    SUBJECTIVE:         Chief Complaint   Patient presents with   ??? follow up blood pressure       History of Present Illness  Patient presents to clinic for:      #Left Ankle Fx     ?? Sustained work related left calcaneal fx  ?? 8 Oct  ?? ORIF 28 Oct     #Refills     ?? Synthroid out 3 days   ?? Feels good  ?? No concerns  ?? Also other refills are needed     #CAD     ?? Gas and GERD prior to last MI  ?? Similar symptoms   ?? Followed up with cardiology last year or so and is due  ?? bloating    #Lipids     ?? Issues with tolerating statins in the past   ?? Oral and injectable      HISTORY:  I have reviewed the patients problem list, current medications, allergies, past medical & surgical history, social history and updated them as needed.    No LMP for male patient.  Allergies   Allergen Reactions   ??? Codeine Nausea And Vomiting   ??? Dilaudid [Hydromorphone] Nausea And Vomiting         Review of Systems    Review of Systems   Constitutional: Negative.    HENT: Negative.    Eyes: Negative.    Respiratory: Negative.    Cardiovascular: Negative.    Gastrointestinal: Positive for abdominal pain.   Endocrine: Negative for cold intolerance, heat intolerance, polydipsia, polyphagia and polyuria.   Genitourinary: Negative.    Musculoskeletal: Positive for arthralgias and joint swelling.   Skin: Negative.    Allergic/Immunologic: Negative.    Neurological: Negative.    Hematological: Negative.    Psychiatric/Behavioral: Negative.              OBJECTIVE:    BP 141/88  - Temp 36.2 ??C (97.2 ??F) (Temporal)     Physical Exam  Vitals reviewed.   Constitutional:       Appearance: Normal appearance.   Cardiovascular:      Rate and Rhythm: Normal rate and regular rhythm.      Pulses: Normal pulses.  Heart sounds: Normal heart sounds.   Pulmonary:      Effort: Pulmonary effort is normal.      Breath sounds: Normal breath sounds.   Skin:     General: Skin is warm and dry.      Capillary Refill: Capillary refill takes less than 2 seconds.   Neurological:      General: No focal deficit present.      Mental Status: He is alert and oriented to person, place, and time. Mental status is at baseline.       TSH back up, will restart med and recheck    Normal PTH                                           Medical decision making for this encounter was Moderate complexity. [facility]  [facility]  [address] [place], [place] [address] ??? Telephone [phone] ??? Fax [phone]  [email or web address]

## 2020-04-05 NOTE — Unmapped (Signed)
ASSESSMENT:  Hx of hypogonadism     PLAN:  Will check testosterone level

## 2020-04-05 NOTE — Unmapped (Signed)
ASSESSMENT:  Hx of CAD s/p sents on meds with good BP but with elevated lipids and hard to tolerate statins.    Starting to have some issues (gas and GERD) that he had prior to his MI    PLAN:  Refill his meds    Will message his cardiologist - might benefit from a cath

## 2020-04-05 NOTE — Unmapped (Addendum)
ASSESSMENT:  Doing well on synthroid, has been out a couple days    PLAN:  Will check TSH and adjust med as needed    TSH a little high will recheck in 6 weeks

## 2020-04-05 NOTE — Unmapped (Signed)
ASSESSMENT:  BP Readings from Last 3 Encounters:   04/04/20 141/88   04/02/20 121/76   01/09/20 137/82     Overall good control, a little higher today than usual    PLAN:  Refilled her meds

## 2020-04-05 NOTE — Unmapped (Signed)
ASSESSMENT:  Hx of hyperlipidemia    Has had issues with tolerating statins in the past     PO and injectable statins both has issues    PLAN:  Will look to get him started on some vasecpa will check about using lopid or gemfibrozil

## 2020-04-05 NOTE — Unmapped (Signed)
ASSESSMENT:  49 yo M with MMP that include hypogonadism, hypothyroidism who has fx bilateral calcaneous in the past 6 months.  1 seems to have a MOI that is consistent with a fx the other is so so.    I wonder if there is an underlying endocrinological basis.  Has had normal Ca++ in the past, Vit D was a little low     PLAN:  Will check a PTH and get a dexa

## 2020-04-05 NOTE — Unmapped (Signed)
Colon Cancer Screening: Care Instructions  Overview     Colorectal cancer occurs in the colon or rectum. That's the lower part of your digestive system. It often starts in small growths called polyps in the colon or rectum. Polyps are usually found with screening tests. Depending on the type of test, any polyps found may be removed during the tests.  Colorectal cancer usually does not cause symptoms at first. But regular tests can help find it early, before it spreads and becomes harder to treat.  Your risk for colorectal cancer gets higher as you get older. Experts recommend starting screening at age 17 for people who are at average risk. Talk with your doctor about your risk and when to start and stop screening. You may have one of several tests.  Follow-up care is a key part of your treatment and safety. Be sure to make and go to all appointments, and call your doctor if you are having problems. It's also a good idea to know your test results and keep a list of the medicines you take.  What are the main screening tests for colon cancer?  The screening tests are:  Stool tests.   These include the guaiac fecal occult blood test (gFOBT), the fecal immunochemical test (FIT), and the combined fecal immunochemical test and stool DNA test (FIT-DNA). These tests check stool samples for signs of cancer. If your test is positive, you will need to have a colonoscopy.  Sigmoidoscopy.   This test lets your doctor look at the lining of your rectum and the lowest part of your colon. Your doctor uses a lighted tube called a sigmoidoscope. This test can't find cancers or polyps in the upper part of your colon. In some cases, polyps that are found can be removed. But if your doctor finds polyps, you will need to have a colonoscopy to check the upper part of your colon.  Colonoscopy.   This test lets your doctor look at the lining of your rectum and your entire colon. The doctor uses a thin, flexible tool called a colonoscope. It can also be used to remove polyps or get a tissue sample (biopsy).  A less common test is CT colonography (CTC). It's also called virtual colonoscopy.  Who should be screened for colorectal cancer?  Your risk for colorectal cancer gets higher as you get older. Experts recommend starting screening at age 54 for people who are at average risk. Talk with your doctor about your risk and when to start and stop screening.  How often you need screening depends on the type of test you get:  Stool tests.   Every year for FIT or gFOBT.   Every 1 to 3 years for sDNA, also called FIT-DNA.   Tests that look inside the colon.   Every 5 years for sigmoidoscopy. (If you do the FIT test every year, you can get this test every 10 years.)   Every 5 years for CT colonography (virtual colonoscopy).   Every 10 years for colonoscopy.  Experts agree that people at higher risk may need to be tested sooner and more often. This includes people who have a strong family history of colon cancer. Talk to your doctor about which test is best for you and when to be tested.  When should you call for help?  Watch closely for changes in your health, and be sure to contact your doctor if:  ?? ?? You have any changes in your bowel habits.   ?? ??  You have any problems.   Where can you learn more?  Go to [facility] at https://myuncchart.org  Select Patient Education under [employer]. Enter M541 in the search box to learn more about Colon Cancer Screening: Care Instructions.  Current as of: April 14, 2019??????????????????????????????Content Version: 13.0  ?? 2006-2021 [employer].   Care instructions adapted under license by [facility]. If you have questions about a medical condition or this instruction, always ask your healthcare professional. Healthwise, Incorporated disclaims any warranty or liability for your use of this information.

## 2020-04-09 NOTE — Unmapped (Signed)
[facility] Pharmacist Intervention    Type of intervention: Insurance changes/ coverage    Medication: Praluent 150mg /mL    Problem: We have been unable to reach Mr Cwynar after several attempts in September. Last fill was 12/21/19 for a 28 day supply. I attempted to call once more today prior to disenrolling. I was able to speak to Mr Richeson. He reports that he has had an insurance change and they will no longer cover Praluent. He also reports he his company is changing insurance carriers starting in January so he is not sure what will be covered then.    Intervention: I informed Mr Rushlow that we have a valid prescription on file for Praluent. I can test claim to see if insurance covers or provides some information on what is covered. Mr Hulbert reports he has an appointment with provider on Thursday and would like to discuss his option then. He will call us back after appointment.    Follow up needed: Pt has provider appointment on Thursday, 12/16. Will wait to hear from him after appointment for next steps.    Approximate time spent: 20 minutes    Camillo Flaming   [facility] Specialty Pharmacist

## 2020-04-10 LAB — TESTOSTERONE, FREE, TOTAL
TESTOSTERONE (MAYO): 259 ng/dL
TESTOSTERONE FREE: 7.77 ng/dL

## 2020-04-12 ENCOUNTER — Ambulatory Visit: Admit: 2020-04-12 | Discharge: 2020-04-13 | Payer: PRIVATE HEALTH INSURANCE

## 2020-04-12 DIAGNOSIS — I209 Angina pectoris, unspecified: Principal | ICD-10-CM

## 2020-04-12 DIAGNOSIS — R7303 Prediabetes: Principal | ICD-10-CM

## 2020-04-12 LAB — CBC
HEMATOCRIT: 44.7 % (ref 38.0–50.0)
HEMOGLOBIN: 14.6 g/dL (ref 13.5–17.5)
MEAN CORPUSCULAR HEMOGLOBIN CONC: 32.7 g/dL (ref 30.0–36.0)
MEAN CORPUSCULAR HEMOGLOBIN: 27.4 pg (ref 26.0–34.0)
MEAN CORPUSCULAR VOLUME: 83.7 fL (ref 81.0–95.0)
MEAN PLATELET VOLUME: 7.3 fL (ref 7.0–10.0)
PLATELET COUNT: 354 10*9/L (ref 150–450)
RED BLOOD CELL COUNT: 5.34 10*12/L (ref 4.32–5.72)
RED CELL DISTRIBUTION WIDTH: 16.3 % — ABNORMAL HIGH (ref 12.0–15.0)
WBC ADJUSTED: 6.1 10*9/L (ref 3.5–10.5)

## 2020-04-12 LAB — BASIC METABOLIC PANEL
ANION GAP: 4 mmol/L — ABNORMAL LOW (ref 5–14)
BLOOD UREA NITROGEN: 17 mg/dL (ref 9–23)
BUN / CREAT RATIO: 14
CALCIUM: 10 mg/dL (ref 8.7–10.4)
CHLORIDE: 105 mmol/L (ref 98–107)
CO2: 29.6 mmol/L (ref 20.0–31.0)
CREATININE: 1.25 mg/dL — ABNORMAL HIGH
EGFR CKD-EPI AA MALE: 77 mL/min/{1.73_m2} (ref >=60)
EGFR CKD-EPI NON-AA MALE: 67 mL/min/{1.73_m2} (ref >=60)
GLUCOSE RANDOM: 100 mg/dL (ref 70–179)
POTASSIUM: 4.6 mmol/L — ABNORMAL HIGH (ref 3.4–4.5)
SODIUM: 139 mmol/L (ref 135–145)

## 2020-04-12 LAB — HEMOGLOBIN A1C
ESTIMATED AVERAGE GLUCOSE: 117 mg/dL
HEMOGLOBIN A1C: 5.7 % — ABNORMAL HIGH (ref 4.8–5.6)

## 2020-04-12 LAB — PROTIME-INR
INR: 1.03
PROTIME: 12 s (ref 10.3–13.4)

## 2020-04-12 NOTE — Unmapped (Addendum)
We will arrange a left heart catheterization  - Tony Perkins or Jennette Kettle will call you to schedule     Call [facility] about the medication Dr. Pascal Lux sent a few weeks ago    Will repeat your A1c for diabetes

## 2020-04-12 NOTE — Unmapped (Signed)
Cardiology Clinic Follow-up Note    Primary Provider: Rolm Bookbinder, MD      Date of Visit: 04/12/2020     Reason for Visit:  Acute visit for chest pain, ischemic heart disease, lipids    Assessment & Plan:  [name] is a 49 y.o. M with CAD s/p PCI to LCx (inferior MI in 06/2013), LHC 12/2017 non-flow limiting CAD, hypertension, type 2 DM, and hyperlipidemia who is here for follow-up of his ischemic heart disease.    Coronary artery disease s/p PCI 2015  -He had an inferior MI in 06/2013, for which he underwent PCI to LCx. Afterward, he had recurrent chest pain for which he underwent repeat LHC in 07/2013, 12/2014 and in 01/2018. All of these showed non-obstructive disease and a patent LCx stent.  Repeated his EKG today which does not show any concerning ischemic changes or significant changes compared to last ECG.  He presents today after experiencing episodes of chest pain that feels like when he had his MI in 2015.  He is noting radiation down his left arm and increasing shortness of breath.  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    -We will repeat his left heart cath for ischemic evaluation.  Reviewed his previous cath with Dr. Andrey Farmer today.  Labs today.  -Continue blood pressure control with carvedilol 12.5 mg twice daily and lisinopril 10 mg twice daily  -Continue aspirin  -He is on daily Cialis for BPH so he should avoid sublingual nitroglycerin and limits our ability to add long-acting nitrate.  He is aware of this interaction.    Hyperlipidemia  He has tried multiple statin medications including pravastatin, rosuvastatin, and atorvastatin. He has tried multiple doses of atorvastatin and has had significant myalgias.    He has also tried Repatha and Praluent in both have caused adverse effects in the week after taking the injection.  He has been tolerating ezetimibe 10 mg but is still not at goal.  His most recent lipid panel noted significantly elevated triglycerides and icosapent ethyl 2 g twice daily was prescribed but he has not picked this up yet.    Lab Results   Component Value Date    LDL  04/04/2020      Comment:      Unable to calculate due to triglyceride greater than 400 mg/dL.      Essential Hypertension  carvedilol to 12.5 mg twice daily  lisinopril 10 mg twice daily.    Lab Results   Component Value Date    CREATININE 0.97 12/26/2019        Pre-Diabetes  Will repeat today.     Lab Results   Component Value Date    A1C 6.0 (H) 02/21/2019      Return in about 4 weeks (around 05/10/2020) for Recheck.     Future Appointments   Date Time Provider Department Center   05/21/2020 11:15 AM Myrtis Hopping, ANP [facility]      I personally spent 45 minutes face-to-face and non-face-to-face in the care of this patient, which includes all pre, intra, and post visit time on the date of service.     History of Present Illness:  [name] is a 49 y.o. M with CAD s/p PCI to LCx (inferior MI in 06/2013), hypertension, type 2 DM, hyperlipidemia, sarcoidosis, hypothyroidism, hypogonadism who is here for follow-up of his ischemic heart disease.  He was last seen in the lipid clinic earlier this year and has tried both Publishing rights manager and Repatha.  He has had difficulty tolerating both of those noting that in the week after taking the injection he feels body aches and fatigue which improves and then he would take another dose and they resume.  He has had difficulty tolerating statins and has tried numerous statins including pravastatin, atorvastatin, and rosuvastatin.  He has tried multiple doses in multiple different regimens and has not been able to tolerate.  He presents today to report an increase in chest pain that has been occurring over the past 2 to 3 months.  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  He had surgery on October 28 and has not been able to bear weight since then. He is hoping to bear weight in January 2022.  He reports that he is feeling indigestion-like chest pain and increasing episodes over time.  Some of them feel similar to his previous MI which has concerned him.  He has not been as active since his injury as his job was previously fairly active so he is noting more shortness of breath when scooting around his home and shopping.  He has noted some radiation down his left arm and at times night sweats.    Cardiovascular History:  ?? Hypertension.  ?? Hyperlipidemia.  ?? Type 2 diabetes mellitus.  ?? Coronary artery disease s/p PCI.    Interventions / Surgery:  ?? Cardiac catheterization, 2007:  No significant coronary disease.  ?? Cardiac catheterization (06/22/2009):  No significant coronary artery disease.   ?? Cardiac catheterization and PCI (07/23/2013; [facility]): Acute inferior MI with occluded distal LCx (LPDA). Xience 2.25 x 18 mm DES. Non-obstructive plaque elsewhere.  ?? Cardiac catheterization (08/11/2013; [facility]): Done for persistent chest pain with exertional chest pain on exercise ECG. Widely patent stent with non-obstructive plaque elsewhere.  ?? Cardiac catheterization (01/17/15; [facility]): Widely patent stent. No flow-limiting coronary artery disease.  ?? Cardiac catheterization -02/12/2018: no flow-limiting coronary artery disease  ?? Cardiac catheterization . . .     Imaging:  ?? Echocardiogram (04/30/2012): Normal left ventricular size and contractility.  Impaired left ventricular relaxation, without evidence of significant diastolic dysfunction.  Mild mitral valve prolapse with trivial regurgitation.  ?? Echocardiogram (08/04/2013): Normal left ventricular size and contractility.  No significant valvular disease.  ?? ECG (07/13/2014): Sinus rhythm and within normal limits.  ?? ECG (01/14/18): Sinus bradycardia (HR 59); no ST or T wave changes.  ?? Echo 02/12/2018: Normal LV function and no significant valvular abnormalities  ?? Echo 04/02/2020: Normal LV function and no significant valvular abnormalities    Past Medical & Surgical History:  Reviewed in EMR.    Allergies:  Allergies   Allergen Reactions   ??? Codeine Nausea And Vomiting   ??? Dilaudid [Hydromorphone] Nausea And Vomiting      Pertinent Medications (complete listing reviewed in EMR):    Current Outpatient Medications:   ???  ascorbic acid (VITAMIN C) 1000 MG tablet, Take 1,000 mg by mouth Two (2) times a day. , Disp: , Rfl:   ???  aspirin (ECOTRIN) 81 MG tablet, Take 81 mg by mouth daily., Disp: , Rfl:   ???  carvediloL (COREG) 12.5 MG tablet, Take 1 tablet (12.5 mg total) by mouth Two (2) times a day., Disp: 180 tablet, Rfl: 3  ???  empty container Misc, Use as directed to dispose of injectable  medications, Disp: 1 each, Rfl: 3  ???  ezetimibe (ZETIA) 10 mg tablet, Take 1 tablet (10 mg total) by mouth daily., Disp: 90 tablet, Rfl: 3  ???  fluticasone propionate (FLONASE) 50 mcg/actuation nasal spray, 2 sprays into each nostril daily., Disp: 16 g, Rfl: 0  ???  icosapent ethyL 1 gram cap, Take 2 capsules (2 g total) by mouth Two (2) times a day., Disp: 360 capsule, Rfl: 3  ???  ipratropium-albuteroL (DUO-NEB) 0.5-2.5 mg/3 mL nebulizer, Inhale 3 mL by nebulization every six (6) hours as needed (shortness of breath and wheezing) for up to 7 days., Disp: 84 mL, Rfl: 0  ???  levothyroxine (SYNTHROID) 112 MCG tablet, Take 1 tablet (112 mcg total) by mouth daily., Disp: 60 tablet, Rfl: 5  ???  lisinopriL (PRINIVIL,ZESTRIL) 10 MG tablet, Take 1 tablet (10 mg total) by mouth two (2) times a day., Disp: 180 tablet, Rfl: 3  ???  omeprazole (PRILOSEC) 20 MG capsule, Take 1 capsule (20 mg total) by mouth daily as needed., Disp: 90 capsule, Rfl: 3  ???  syringe with needle (TERUMO SYRINGE) 3 mL 23 gauge x 1 1/2 Syrg, 1 Syringe by Miscellaneous route once a week., Disp: 25 each, Rfl: 1  ???  tadalafiL (CIALIS) 5 MG tablet, Take 1 tablet (5 mg total) by mouth daily., Disp: 90 tablet, Rfl: 3  ???  testosterone cypionate (DEPOTESTOTERONE CYPIONATE) 200 mg/mL injection, Inject 0.6 mL (120 mg total) into the muscle once a week. Single use Vials, Disp: 4 mL, Rfl: 5     Review of Systems:  Review of ten systems is negative or unremarkable except as stated above.    Physical Exam:  VITAL SIGNS: BP 138/82 (BP Site: L Arm, BP Position: Sitting)  - Pulse 78  - Resp 18  - Ht 165.1 cm (5' 5)  - Wt 95.3 kg (210 lb 3.2 oz)  - SpO2 98%  - BMI 34.98 kg/m??   GENERAL: Well-appearing, AAOx4, very pleasant.  HEENT: [place]/AT, PERRL, MMM.  NECK: Supple. No JVD. No carotid bruits.  CARDIOVASCULAR: RRR; no m/r/g appreciated. Peripheral pulses equal and palpable throughout.  RESPIRATORY: CTAB; no w/r/r; normal WOB on room air.  EXTREMITIES: No e/c/c.  SKIN: Warm and well perfused.  NEUROLOGIC: No gross motor or sensory deficits evident.    Pertinent Laboratory Studies:   Lab Results   Component Value Date    PRO-BNP 14 09/21/2012    Triglyceride, POC 245 (H) 09/23/2019    Triglycerides 448 (H) 04/04/2020    Triglycerides 242 (H) 12/26/2019    Triglycerides 221 (H) 09/23/2019    Triglycerides 110 07/13/2014    Triglycerides 106 01/05/2014    Triglycerides 117 09/27/2013    HDL Cholesterol, POC 58 09/23/2019    HDL 43 04/04/2020    HDL 45 12/26/2019    HDL 48 09/23/2019    HDL 50 07/13/2014    HDL 44 01/05/2014    HDL 33 (L) 09/27/2013    Non-HDL Cholesterol 200 (H) 04/04/2020    Non-HDL Cholesterol 174 (H) 12/26/2019    Non-HDL Cholesterol 203 (H) 09/23/2019    LDL Cholesterol, POC 131 09/23/2019    LDL Calculated  04/04/2020      Comment:      Unable to calculate due to triglyceride greater than 400 mg/dL.    LDL Calculated 126 (H) 12/26/2019    LDL Calculated 159 (H) 09/23/2019    LDL Direct 169 09/21/2012    LDL Direct 134 10/11/2010  LDL Cholesterol, Calculated 65 07/13/2014    LDL Cholesterol, Calculated 101 01/05/2014    LDL Cholesterol, Calculated 36 09/27/2013    Creatinine/FP 0.94 06/07/2012    Creatinine 0.97 12/26/2019    Creatinine 1.03 01/05/2014    BUN/FP 15 06/07/2012 BUN 15 12/26/2019    BUN 21 01/05/2014    Potassium/FP 4.7 06/07/2012    Potassium 3.9 12/26/2019    Potassium 4.9 01/05/2014    Magnesium 1.7 06/07/2012    WBC 6.1 04/12/2020    WBC 6.3 09/27/2013    HGB 14.6 04/12/2020    HGB 15.9 09/27/2013    HCT 44.7 04/12/2020    HCT 47.4 09/27/2013    Platelet 354 04/12/2020    Platelet 326 09/27/2013    INR 1.07 01/17/2015    INR 1.0 10/07/2011    INR 1.0 07/24/2011

## 2020-04-12 NOTE — Unmapped (Signed)
Cardiology Clinic Follow-up Note    Primary Provider: Rolm Bookbinder, MD      Date of Visit: 04/12/2020     Reason for Visit:  Acute visit for chest pain, ischemic heart disease, lipids    Assessment & Plan:  Tony Perkins is a 49 y.o. M with CAD s/p PCI to LCx (inferior MI in 06/2013), LHC 12/2017 non-flow limiting CAD, hypertension, type 2 DM, and hyperlipidemia who is here for follow-up of his ischemic heart disease.    Coronary artery disease s/p PCI 2015  -He had an inferior MI in 06/2013, for which he underwent PCI to LCx. Afterward, he had recurrent chest pain for which he underwent repeat LHC in 07/2013, 12/2014 and in 01/2018. All of these showed non-obstructive disease and a patent LCx stent.  Repeated his EKG today which does not show any concerning ischemic changes or significant changes compared to last ECG.  He presents today after experiencing episodes of chest pain that feels like when he had his MI in 2015.  He is noting radiation down his left arm and increasing shortness of breath.  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    -We will repeat his left heart cath for ischemic evaluation.  Reviewed his previous cath with Dr. Andrey Farmer today.  Labs today.  -Continue blood pressure control with carvedilol 12.5 mg twice daily and lisinopril 10 mg twice daily  -Continue aspirin  -He is on daily Cialis for BPH so he should avoid sublingual nitroglycerin and limits our ability to add long-acting nitrate.  He is aware of this interaction.    Hyperlipidemia  He has tried multiple statin medications including pravastatin, rosuvastatin, and atorvastatin. He has tried multiple doses of atorvastatin and has had significant myalgias.    He has also tried Repatha and Praluent in both have caused adverse effects in the week after taking the injection.  He has been tolerating ezetimibe 10 mg but is still not at goal.  His most recent lipid panel noted significantly elevated triglycerides and icosapent ethyl 2 g twice daily was prescribed but he has not picked this up yet.    Lab Results   Component Value Date    LDL  04/04/2020      Comment:      Unable to calculate due to triglyceride greater than 400 mg/dL.      Essential Hypertension  carvedilol to 12.5 mg twice daily  lisinopril 10 mg twice daily.    Lab Results   Component Value Date    CREATININE 0.97 12/26/2019        Pre-Diabetes  Will repeat today.     Lab Results   Component Value Date    A1C 6.0 (H) 02/21/2019      Return in about 4 weeks (around 05/10/2020) for Recheck.     Future Appointments   Date Time Provider Department Center   05/21/2020 11:15 AM Myrtis Hopping, ANP [facility]      I personally spent 45 minutes face-to-face and non-face-to-face in the care of this patient, which includes all pre, intra, and post visit time on the date of service.     History of Present Illness:  Tony Perkins is a 49 y.o. M with CAD s/p PCI to LCx (inferior MI in 06/2013), hypertension, type 2 DM, hyperlipidemia, sarcoidosis, hypothyroidism, hypogonadism who is here for follow-up of his ischemic heart disease.  He was last seen in the lipid clinic earlier this year and has tried both Publishing rights [employer] and Repatha.  He has had difficulty tolerating both of those noting that in the week after taking the injection he feels body aches and fatigue which improves and then he would take another dose and they resume.  He has had difficulty tolerating statins and has tried numerous statins including pravastatin, atorvastatin, and rosuvastatin.  He has tried multiple doses in multiple different regimens and has not been able to tolerate.  He presents today to report an increase in chest pain that has been occurring over the past 2 to 3 months.  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  He had surgery on October 28 and has not been able to bear weight since then. He is hoping to bear weight in January 2022.  He reports that he is feeling indigestion-like chest pain and increasing episodes over time.  Some of them feel similar to his previous MI which has concerned him.  He has not been as active since his injury as his job was previously fairly active so he is noting more shortness of breath when scooting around his home and shopping.  He has noted some radiation down his left arm and at times night sweats.    Cardiovascular History:  ?? Hypertension.  ?? Hyperlipidemia.  ?? Type 2 diabetes mellitus.  ?? Coronary artery disease s/p PCI.    Interventions / Surgery:  ?? Cardiac catheterization, 2007:  No significant coronary disease.  ?? Cardiac catheterization (06/22/2009):  No significant coronary artery disease.   ?? Cardiac catheterization and PCI (07/23/2013; [facility]): Acute inferior MI with occluded distal LCx (LPDA). Xience 2.25 x 18 mm DES. Non-obstructive plaque elsewhere.  ?? Cardiac catheterization (08/11/2013; [facility]): Done for persistent chest pain with exertional chest pain on exercise ECG. Widely patent stent with non-obstructive plaque elsewhere.  ?? Cardiac catheterization (01/17/15; [facility]): Widely patent stent. No flow-limiting coronary artery disease.  ?? Cardiac catheterization -02/12/2018: no flow-limiting coronary artery disease  ?? Cardiac catheterization . . .     Imaging:  ?? Echocardiogram (04/30/2012): Normal left ventricular size and contractility.  Impaired left ventricular relaxation, without evidence of significant diastolic dysfunction.  Mild mitral valve prolapse with trivial regurgitation.  ?? Echocardiogram (08/04/2013): Normal left ventricular size and contractility.  No significant valvular disease.  ?? ECG (07/13/2014): Sinus rhythm and within normal limits.  ?? ECG (01/14/18): Sinus bradycardia (HR 59); no ST or T wave changes.  ?? Echo 02/12/2018: Normal LV function and no significant valvular abnormalities  ?? Echo 04/02/2020: Normal LV function and no significant valvular abnormalities    Past Medical & Surgical History:  Reviewed in EMR.    Allergies:  Allergies   Allergen Reactions   ??? Codeine Nausea And Vomiting   ??? Dilaudid [Hydromorphone] Nausea And Vomiting      Pertinent Medications (complete listing reviewed in EMR):    Current Outpatient Medications:   ???  ascorbic acid (VITAMIN C) 1000 MG tablet, Take 1,000 mg by mouth Two (2) times a day. , Disp: , Rfl:   ???  aspirin (ECOTRIN) 81 MG tablet, Take 81 mg by mouth daily., Disp: , Rfl:   ???  carvediloL (COREG) 12.5 MG tablet, Take 1 tablet (12.5 mg total) by mouth Two (2) times a day., Disp: 180 tablet, Rfl: 3  ???  empty container Misc, Use as directed to dispose of injectable  medications, Disp: 1 each, Rfl: 3  ???  ezetimibe (ZETIA) 10 mg tablet, Take 1 tablet (10 mg total) by mouth daily., Disp: 90 tablet, Rfl: 3  ???  fluticasone propionate (FLONASE) 50 mcg/actuation nasal spray, 2 sprays into each nostril daily., Disp: 16 g, Rfl: 0  ???  icosapent ethyL 1 gram cap, Take 2 capsules (2 g total) by mouth Two (2) times a day., Disp: 360 capsule, Rfl: 3  ???  ipratropium-albuteroL (DUO-NEB) 0.5-2.5 mg/3 mL nebulizer, Inhale 3 mL by nebulization every six (6) hours as needed (shortness of breath and wheezing) for up to 7 days., Disp: 84 mL, Rfl: 0  ???  levothyroxine (SYNTHROID) 112 MCG tablet, Take 1 tablet (112 mcg total) by mouth daily., Disp: 60 tablet, Rfl: 5  ???  lisinopriL (PRINIVIL,ZESTRIL) 10 MG tablet, Take 1 tablet (10 mg total) by mouth two (2) times a day., Disp: 180 tablet, Rfl: 3  ???  omeprazole (PRILOSEC) 20 MG capsule, Take 1 capsule (20 mg total) by mouth daily as needed., Disp: 90 capsule, Rfl: 3  ???  syringe with needle (TERUMO SYRINGE) 3 mL 23 gauge x 1 1/2 Syrg, 1 Syringe by Miscellaneous route once a week., Disp: 25 each, Rfl: 1  ???  tadalafiL (CIALIS) 5 MG tablet, Take 1 tablet (5 mg total) by mouth daily., Disp: 90 tablet, Rfl: 3  ???  testosterone cypionate (DEPOTESTOTERONE CYPIONATE) 200 mg/mL injection, Inject 0.6 mL (120 mg total) into the muscle once a week. Single use Vials, Disp: 4 mL, Rfl: 5     Review of Systems:  Review of ten systems is negative or unremarkable except as stated above.    Physical Exam:  VITAL SIGNS: BP 138/82 (BP Site: L Arm, BP Position: Sitting)  - Pulse 78  - Resp 18  - Ht 165.1 cm (5' 5)  - Wt 95.3 kg (210 lb 3.2 oz)  - SpO2 98%  - BMI 34.98 kg/m??   GENERAL: Well-appearing, AAOx4, very pleasant.  HEENT: [place]/AT, PERRL, MMM.  NECK: Supple. No JVD. No carotid bruits.  CARDIOVASCULAR: RRR; no m/r/g appreciated. Peripheral pulses equal and palpable throughout.  RESPIRATORY: CTAB; no w/r/r; normal WOB on room air.  EXTREMITIES: No e/c/c.  SKIN: Warm and well perfused.  NEUROLOGIC: No gross motor or sensory deficits evident.    Pertinent Laboratory Studies:   Lab Results   Component Value Date    PRO-BNP 14 09/21/2012    Triglyceride, POC 245 (H) 09/23/2019    Triglycerides 448 (H) 04/04/2020    Triglycerides 242 (H) 12/26/2019    Triglycerides 221 (H) 09/23/2019    Triglycerides 110 07/13/2014    Triglycerides 106 01/05/2014    Triglycerides 117 09/27/2013    HDL Cholesterol, POC 58 09/23/2019    HDL 43 04/04/2020    HDL 45 12/26/2019    HDL 48 09/23/2019    HDL 50 07/13/2014    HDL 44 01/05/2014    HDL 33 (L) 09/27/2013    Non-HDL Cholesterol 200 (H) 04/04/2020    Non-HDL Cholesterol 174 (H) 12/26/2019    Non-HDL Cholesterol 203 (H) 09/23/2019    LDL Cholesterol, POC 131 09/23/2019    LDL Calculated  04/04/2020      Comment:      Unable to calculate due to triglyceride greater than 400 mg/dL.    LDL Calculated 126 (H) 12/26/2019    LDL Calculated 159 (H) 09/23/2019    LDL Direct 169 09/21/2012    LDL Direct 134 10/11/2010  LDL Cholesterol, Calculated 65 07/13/2014    LDL Cholesterol, Calculated 101 01/05/2014    LDL Cholesterol, Calculated 36 09/27/2013    Creatinine/FP 0.94 06/07/2012    Creatinine 0.97 12/26/2019    Creatinine 1.03 01/05/2014    BUN/FP 15 06/07/2012 BUN 15 12/26/2019    BUN 21 01/05/2014    Potassium/FP 4.7 06/07/2012    Potassium 3.9 12/26/2019    Potassium 4.9 01/05/2014    Magnesium 1.7 06/07/2012    WBC 6.1 04/12/2020    WBC 6.3 09/27/2013    HGB 14.6 04/12/2020    HGB 15.9 09/27/2013    HCT 44.7 04/12/2020    HCT 47.4 09/27/2013    Platelet 354 04/12/2020    Platelet 326 09/27/2013    INR 1.07 01/17/2015    INR 1.0 10/07/2011    INR 1.0 07/24/2011

## 2020-04-12 NOTE — Unmapped (Signed)
This patient has been disenrolled from the [facility] specialty pharmacy services due to medication discontinuation resulting from side effect intolerance.    Camillo Flaming  [facility] Specialty Pharmacist

## 2020-04-13 DIAGNOSIS — I2510 Atherosclerotic heart disease of native coronary artery without angina pectoris: Principal | ICD-10-CM

## 2020-04-13 DIAGNOSIS — I209 Angina pectoris, unspecified: Principal | ICD-10-CM

## 2020-04-13 DIAGNOSIS — I252 Old myocardial infarction: Principal | ICD-10-CM

## 2020-04-13 DIAGNOSIS — I10 Essential (primary) hypertension: Principal | ICD-10-CM

## 2020-04-13 DIAGNOSIS — Z885 Allergy status to narcotic agent status: Principal | ICD-10-CM

## 2020-04-13 DIAGNOSIS — R0602 Shortness of breath: Principal | ICD-10-CM

## 2020-04-13 DIAGNOSIS — E785 Hyperlipidemia, unspecified: Principal | ICD-10-CM

## 2020-04-13 DIAGNOSIS — I25119 Atherosclerotic heart disease of native coronary artery with unspecified angina pectoris: Principal | ICD-10-CM

## 2020-04-13 DIAGNOSIS — R079 Chest pain, unspecified: Principal | ICD-10-CM

## 2020-04-13 DIAGNOSIS — I259 Chronic ischemic heart disease, unspecified: Principal | ICD-10-CM

## 2020-04-13 DIAGNOSIS — E119 Type 2 diabetes mellitus without complications: Principal | ICD-10-CM

## 2020-04-13 DIAGNOSIS — D869 Sarcoidosis, unspecified: Principal | ICD-10-CM

## 2020-04-13 DIAGNOSIS — Z79899 Other long term (current) drug therapy: Principal | ICD-10-CM

## 2020-04-13 DIAGNOSIS — Z79890 Hormone replacement therapy (postmenopausal): Principal | ICD-10-CM

## 2020-04-13 DIAGNOSIS — M79602 Pain in left arm: Principal | ICD-10-CM

## 2020-04-13 DIAGNOSIS — Z7982 Long term (current) use of aspirin: Principal | ICD-10-CM

## 2020-04-13 DIAGNOSIS — E039 Hypothyroidism, unspecified: Principal | ICD-10-CM

## 2020-04-13 DIAGNOSIS — M791 Myalgia, unspecified site: Principal | ICD-10-CM

## 2020-04-13 DIAGNOSIS — N400 Enlarged prostate without lower urinary tract symptoms: Principal | ICD-10-CM

## 2020-04-13 DIAGNOSIS — M79601 Pain in right arm: Principal | ICD-10-CM

## 2020-04-23 ENCOUNTER — Encounter: Admit: 2020-04-23 | Discharge: 2020-04-23 | Payer: PRIVATE HEALTH INSURANCE

## 2020-04-23 MED ORDER — CARVEDILOL 12.5 MG TABLET
ORAL_TABLET | Freq: Two times a day (BID) | ORAL | 0 refills | 30.00000 days | Status: CP
Start: 2020-04-23 — End: 2020-06-18

## 2020-05-24 ENCOUNTER — Ambulatory Visit: Payer: BC Managed Care – PPO | Admitting: Podiatry

## 2020-05-24 ENCOUNTER — Encounter: Payer: Self-pay | Admitting: Podiatry

## 2020-05-24 ENCOUNTER — Other Ambulatory Visit: Payer: Self-pay

## 2020-05-24 DIAGNOSIS — S92061S Displaced intraarticular fracture of right calcaneus, sequela: Secondary | ICD-10-CM

## 2020-05-24 DIAGNOSIS — M25571 Pain in right ankle and joints of right foot: Secondary | ICD-10-CM | POA: Diagnosis not present

## 2020-05-24 MED ORDER — BETAMETHASONE SOD PHOS & ACET 6 (3-3) MG/ML IJ SUSP
6.0000 mg | Freq: Once | INTRAMUSCULAR | Status: AC
  Administered 2020-05-24: 6 mg

## 2020-05-24 NOTE — Progress Notes (Signed)
  Subjective:  Patient ID: Manuel Stevenson, male    DOB: 1970/08/27,  MRN: [id number]  Chief Complaint  Patient presents with  . Foot Problem    The right foot hurts and I have been favoring it and I have been waiting on the brace since July of last year   50 y.o. male presents with the above complaint. History confirmed with patient.   Interval issues include left calcaneus fracture which subsequently underwent ORIF. Still healing, putting 50% weight on the left foot.  He does report being somewhat overwhelmed with now severe injury to BLE and is concerned about return to normal life and work.  Objective:  Physical Exam: warm, good capillary refill, no trophic changes or ulcerative lesions, normal DP and PT pulses and normal sensory exam. Left Foot: in boot, not examined. Right Foot: pain at the sinus tarsi; STJ with reduced inv/ev  Assessment:   1. Sinus tarsitis, right   2. Closed displaced intra-articular fracture of right calcaneus, sequela    Plan:  Patient was evaluated and treated and all questions answered.  Arthritis, Subtalar/Ankle -Repeat injection to the right subtalar joint. -Still pending brace -XR at next visit Right ankle -Handicap placard written for patient. -May benefit from STJ fusion at a later date however would hold off while he is still healing from his left foot.  Procedure: Joint Injection Location: Right ST joint Skin Prep: Alcohol. Injectate: 0.5 cc 1% lidocaine plain, 1 cc celestone Disposition: Patient tolerated procedure well. Injection site dressed with a band-aid.  No follow-ups on file.

## 2020-06-06 ENCOUNTER — Other Ambulatory Visit: Payer: Self-pay

## 2020-06-06 ENCOUNTER — Other Ambulatory Visit: Payer: BC Managed Care – PPO | Admitting: Orthotics

## 2020-06-18 ENCOUNTER — Encounter: Admit: 2020-06-18 | Discharge: 2020-06-19 | Payer: PRIVATE HEALTH INSURANCE

## 2020-06-18 DIAGNOSIS — I10 Essential (primary) hypertension: Principal | ICD-10-CM

## 2020-06-18 DIAGNOSIS — E781 Pure hyperglyceridemia: Principal | ICD-10-CM

## 2020-06-18 DIAGNOSIS — I2510 Atherosclerotic heart disease of native coronary artery without angina pectoris: Principal | ICD-10-CM

## 2020-06-18 DIAGNOSIS — E785 Hyperlipidemia, unspecified: Principal | ICD-10-CM

## 2020-06-18 MED ORDER — ROSUVASTATIN 5 MG TABLET
ORAL_TABLET | Freq: Every day | ORAL | 1 refills | 30 days | Status: CP
Start: 2020-06-18 — End: ?

## 2020-06-18 MED ORDER — CARVEDILOL 25 MG TABLET
ORAL_TABLET | Freq: Two times a day (BID) | ORAL | 3 refills | 90.00000 days | Status: CP
Start: 2020-06-18 — End: ?

## 2020-06-18 MED ORDER — ICOSAPENT ETHYL 1 GRAM CAPSULE
ORAL_CAPSULE | Freq: Two times a day (BID) | ORAL | 3 refills | 90 days | Status: CP
Start: 2020-06-18 — End: ?

## 2020-06-20 MED ORDER — VASCEPA 1 GRAM CAPSULE
ORAL_CAPSULE | Freq: Two times a day (BID) | ORAL | 3 refills | 45.00000 days | Status: CP
Start: 2020-06-20 — End: ?

## 2020-08-17 DIAGNOSIS — G5752 Tarsal tunnel syndrome, left lower limb: Secondary | ICD-10-CM | POA: Insufficient documentation

## 2020-09-17 ENCOUNTER — Ambulatory Visit: Admit: 2020-09-17 | Payer: PRIVATE HEALTH INSURANCE

## 2020-11-25 ENCOUNTER — Encounter
Admit: 2020-11-25 | Discharge: 2020-11-25 | Disposition: A | Discharge status: Home / Self Care | Payer: PRIVATE HEALTH INSURANCE | Attending: Family Medicine

## 2020-11-25 ENCOUNTER — Ambulatory Visit
Admit: 2020-11-25 | Discharge: 2020-11-25 | Disposition: A | Discharge status: Home / Self Care | Payer: PRIVATE HEALTH INSURANCE | Attending: Family Medicine

## 2020-11-25 DIAGNOSIS — M25551 Pain in right hip: Principal | ICD-10-CM

## 2020-11-25 MED ORDER — PREDNISONE 20 MG TABLET
ORAL_TABLET | Freq: Every day | ORAL | 0 refills | 4 days | Status: CP
Start: 2020-11-25 — End: 2020-11-29

## 2020-11-28 ENCOUNTER — Encounter: Admit: 2020-11-28 | Discharge: 2020-11-29 | Payer: PRIVATE HEALTH INSURANCE

## 2020-11-28 DIAGNOSIS — M7061 Trochanteric bursitis, right hip: Principal | ICD-10-CM

## 2020-12-04 ENCOUNTER — Encounter: Admit: 2020-12-04 | Discharge: 2020-12-05 | Payer: PRIVATE HEALTH INSURANCE

## 2020-12-04 DIAGNOSIS — M7061 Trochanteric bursitis, right hip: Principal | ICD-10-CM

## 2020-12-04 DIAGNOSIS — Z9689 Presence of other specified functional implants: Principal | ICD-10-CM

## 2020-12-04 DIAGNOSIS — Z981 Arthrodesis status: Principal | ICD-10-CM

## 2020-12-04 DIAGNOSIS — M5416 Radiculopathy, lumbar region: Principal | ICD-10-CM

## 2020-12-10 ENCOUNTER — Ambulatory Visit: Payer: BC Managed Care – PPO | Admitting: Podiatry

## 2020-12-10 ENCOUNTER — Ambulatory Visit (INDEPENDENT_AMBULATORY_CARE_PROVIDER_SITE_OTHER): Payer: BC Managed Care – PPO

## 2020-12-10 ENCOUNTER — Other Ambulatory Visit: Payer: Self-pay

## 2020-12-10 DIAGNOSIS — S92061S Displaced intraarticular fracture of right calcaneus, sequela: Secondary | ICD-10-CM

## 2020-12-10 DIAGNOSIS — M25571 Pain in right ankle and joints of right foot: Secondary | ICD-10-CM

## 2020-12-10 DIAGNOSIS — M19071 Primary osteoarthritis, right ankle and foot: Secondary | ICD-10-CM

## 2020-12-10 MED ORDER — TRIAMCINOLONE ACETONIDE 40 MG/ML IJ SUSP: 40.0000 mg | Freq: Once | INTRAMUSCULAR | Status: AC

## 2020-12-10 NOTE — Progress Notes (Signed)
  Subjective:  Patient ID: Manuel Stevenson, male    DOB: [birth year],  MRN: [id number]  Chief Complaint  Patient presents with   Ankle Pain    F/U Rt ankle pain -pt states," a lot of sever painj at my ankle, real tender to step on." - w/ weakness, and instability with certain movements - pain radiates up the leg- 4/10 pain -w/ swellign tx: brace, tylenol/Ibu and OTC topical rub   50 y.o. male presents with the above complaint. History confirmed with patient.   Right foot is still hurting a lot and has a feeling of it giving out.  Left foot is healed, completed work eval and found to have a 21% permanent impairment. Objective:  Physical Exam: warm, good capillary refill, no trophic changes or ulcerative lesions, normal DP and PT pulses and normal sensory exam. Left Foot: not examined Right Foot:  pain at the sinus tarsi ; STJ with reduced inv/ev and slight crepitus.  Good hindfoot position noted  Assessment:   1. Arthritis of right ankle   2. Sinus tarsitis, right   3. Closed displaced intra-articular fracture of right calcaneus, sequela    Plan:  Patient was evaluated and treated and all questions answered.  Arthritis, Subtalar/Ankle -Repeat injection right subtalar joint -May need revision to subtalar arthrodesis. Will discus if pain persists and is not alleviated with injections. Likely will need CT prior to this.  We will discuss more should issues persist.  Procedure: Injection Intermediate Joint Consent: Verbal consent obtained. Location: Right sinus tarsi. Skin Prep: alcohol. Injectate: 1 cc 0.5% marcaine plain, 1 cc Kenalog 40 Disposition: Patient tolerated procedure well. Injection site dressed with a band-aid.  No follow-ups on file.

## 2020-12-28 ENCOUNTER — Ambulatory Visit: Admit: 2020-12-28 | Discharge: 2020-12-29 | Payer: PRIVATE HEALTH INSURANCE

## 2021-01-01 ENCOUNTER — Ambulatory Visit: Admit: 2021-01-01 | Discharge: 2021-01-02 | Payer: PRIVATE HEALTH INSURANCE

## 2021-01-01 ENCOUNTER — Encounter: Admit: 2021-01-01 | Discharge: 2021-01-02 | Payer: PRIVATE HEALTH INSURANCE

## 2021-01-01 DIAGNOSIS — Z9689 Presence of other specified functional implants: Principal | ICD-10-CM

## 2021-01-01 DIAGNOSIS — Z981 Arthrodesis status: Principal | ICD-10-CM

## 2021-01-01 DIAGNOSIS — M48062 Spinal stenosis, lumbar region with neurogenic claudication: Principal | ICD-10-CM

## 2021-01-01 MED ORDER — CHLORHEXIDINE GLUCONATE 4 % TOPICAL LIQUID
Freq: Two times a day (BID) | TOPICAL | 0 refills | 111 days | Status: CP
Start: 2021-01-01 — End: 2021-01-04

## 2021-01-01 MED ORDER — MUPIROCIN 2 % TOPICAL OINTMENT
Freq: Two times a day (BID) | TOPICAL | 0 refills | 3 days | Status: CP
Start: 2021-01-01 — End: 2021-01-04

## 2021-01-16 DIAGNOSIS — M5416 Radiculopathy, lumbar region: Principal | ICD-10-CM

## 2021-01-16 DIAGNOSIS — M5116 Intervertebral disc disorders with radiculopathy, lumbar region: Principal | ICD-10-CM

## 2021-01-16 DIAGNOSIS — M48061 Spinal stenosis, lumbar region without neurogenic claudication: Principal | ICD-10-CM

## 2021-01-16 DIAGNOSIS — Q762 Congenital spondylolisthesis: Principal | ICD-10-CM

## 2021-01-16 DIAGNOSIS — Z981 Arthrodesis status: Principal | ICD-10-CM

## 2021-01-16 DIAGNOSIS — M4315 Spondylolisthesis, thoracolumbar region: Principal | ICD-10-CM

## 2021-01-16 DIAGNOSIS — M5126 Other intervertebral disc displacement, lumbar region: Principal | ICD-10-CM

## 2021-02-05 ENCOUNTER — Encounter: Admit: 2021-02-05 | Discharge: 2021-02-06 | Payer: PRIVATE HEALTH INSURANCE

## 2021-02-05 ENCOUNTER — Ambulatory Visit: Admit: 2021-02-05 | Discharge: 2021-02-06 | Payer: PRIVATE HEALTH INSURANCE

## 2021-02-05 DIAGNOSIS — E119 Type 2 diabetes mellitus without complications: Principal | ICD-10-CM

## 2021-02-05 DIAGNOSIS — G4733 Obstructive sleep apnea (adult) (pediatric): Principal | ICD-10-CM

## 2021-02-05 DIAGNOSIS — M5416 Radiculopathy, lumbar region: Principal | ICD-10-CM

## 2021-02-05 DIAGNOSIS — Z9289 Personal history of other medical treatment: Principal | ICD-10-CM

## 2021-02-05 DIAGNOSIS — M4315 Spondylolisthesis, thoracolumbar region: Principal | ICD-10-CM

## 2021-02-05 DIAGNOSIS — E039 Hypothyroidism, unspecified: Principal | ICD-10-CM

## 2021-02-05 DIAGNOSIS — K21 Gastroesophageal reflux disease with esophagitis, unspecified whether hemorrhage: Principal | ICD-10-CM

## 2021-02-05 DIAGNOSIS — S32009K Unspecified fracture of unspecified lumbar vertebra, subsequent encounter for fracture with nonunion: Principal | ICD-10-CM

## 2021-02-05 DIAGNOSIS — Z01818 Encounter for other preprocedural examination: Principal | ICD-10-CM

## 2021-02-05 DIAGNOSIS — Z981 Arthrodesis status: Principal | ICD-10-CM

## 2021-02-05 DIAGNOSIS — M5116 Intervertebral disc disorders with radiculopathy, lumbar region: Principal | ICD-10-CM

## 2021-02-05 DIAGNOSIS — D869 Sarcoidosis, unspecified: Principal | ICD-10-CM

## 2021-02-05 DIAGNOSIS — I10 Essential (primary) hypertension: Principal | ICD-10-CM

## 2021-02-05 DIAGNOSIS — M961 Postlaminectomy syndrome, not elsewhere classified: Principal | ICD-10-CM

## 2021-02-05 DIAGNOSIS — M48061 Spinal stenosis, lumbar region without neurogenic claudication: Principal | ICD-10-CM

## 2021-02-05 DIAGNOSIS — Q762 Congenital spondylolisthesis: Principal | ICD-10-CM

## 2021-02-05 DIAGNOSIS — M5126 Other intervertebral disc displacement, lumbar region: Principal | ICD-10-CM

## 2021-02-05 DIAGNOSIS — I2510 Atherosclerotic heart disease of native coronary artery without angina pectoris: Principal | ICD-10-CM

## 2021-03-14 DIAGNOSIS — S32009K Unspecified fracture of unspecified lumbar vertebra, subsequent encounter for fracture with nonunion: Principal | ICD-10-CM

## 2021-03-14 MED ORDER — CHLORHEXIDINE GLUCONATE 4 % TOPICAL LIQUID
Freq: Two times a day (BID) | TOPICAL | 0 refills | 111 days | Status: CP
Start: 2021-03-14 — End: 2021-03-17

## 2021-03-14 MED ORDER — MUPIROCIN 2 % TOPICAL OINTMENT
Freq: Two times a day (BID) | TOPICAL | 0 refills | 3 days | Status: CP
Start: 2021-03-14 — End: 2021-03-17

## 2021-03-15 DIAGNOSIS — M48061 Spinal stenosis, lumbar region without neurogenic claudication: Principal | ICD-10-CM

## 2021-03-15 DIAGNOSIS — Z981 Arthrodesis status: Principal | ICD-10-CM

## 2021-03-15 DIAGNOSIS — M5126 Other intervertebral disc displacement, lumbar region: Principal | ICD-10-CM

## 2021-03-15 DIAGNOSIS — M5116 Intervertebral disc disorders with radiculopathy, lumbar region: Principal | ICD-10-CM

## 2021-03-15 DIAGNOSIS — M5416 Radiculopathy, lumbar region: Principal | ICD-10-CM

## 2021-03-15 DIAGNOSIS — Q762 Congenital spondylolisthesis: Principal | ICD-10-CM

## 2021-03-15 DIAGNOSIS — M4316 Spondylolisthesis, lumbar region: Principal | ICD-10-CM

## 2021-03-26 ENCOUNTER — Ambulatory Visit
Admit: 2021-03-26 | Discharge: 2021-03-27 | Payer: PRIVATE HEALTH INSURANCE | Attending: Student in an Organized Health Care Education/Training Program | Primary: Student in an Organized Health Care Education/Training Program

## 2021-03-26 MED ORDER — EZETIMIBE 10 MG TABLET
ORAL_TABLET | Freq: Every day | ORAL | 3 refills | 90 days | Status: CP
Start: 2021-03-26 — End: 2022-03-26

## 2021-03-26 MED ORDER — OMEPRAZOLE 20 MG CAPSULE,DELAYED RELEASE
ORAL_CAPSULE | Freq: Every day | ORAL | 3 refills | 90 days | Status: CP | PRN
Start: 2021-03-26 — End: 2022-03-26

## 2021-03-26 MED ORDER — LEVOTHYROXINE 112 MCG TABLET
ORAL_TABLET | Freq: Every day | ORAL | 3 refills | 90.00000 days | Status: CP
Start: 2021-03-26 — End: 2021-03-26

## 2021-03-26 MED ORDER — TADALAFIL 5 MG TABLET
ORAL_TABLET | Freq: Every day | ORAL | 3 refills | 90 days | Status: CP
Start: 2021-03-26 — End: 2022-03-21

## 2021-03-26 MED ORDER — LISINOPRIL 10 MG TABLET
ORAL_TABLET | Freq: Two times a day (BID) | ORAL | 3 refills | 90.00000 days | Status: CP
Start: 2021-03-26 — End: 2022-03-26

## 2021-03-26 MED ORDER — CETIRIZINE 10 MG TABLET
ORAL_TABLET | Freq: Every day | ORAL | 3 refills | 90 days | Status: CP
Start: 2021-03-26 — End: 2022-03-26

## 2021-03-26 MED ORDER — CARVEDILOL 25 MG TABLET
ORAL_TABLET | Freq: Two times a day (BID) | ORAL | 3 refills | 90 days | Status: CP
Start: 2021-03-26 — End: ?

## 2021-06-03 ENCOUNTER — Institutional Professional Consult (permissible substitution): Admit: 2021-06-03 | Discharge: 2021-06-03 | Payer: PRIVATE HEALTH INSURANCE

## 2021-06-03 DIAGNOSIS — S32009K Unspecified fracture of unspecified lumbar vertebra, subsequent encounter for fracture with nonunion: Principal | ICD-10-CM

## 2021-06-04 ENCOUNTER — Encounter: Admit: 2021-06-04 | Discharge: 2021-06-04 | Payer: PRIVATE HEALTH INSURANCE

## 2021-06-04 ENCOUNTER — Ambulatory Visit: Admit: 2021-06-04 | Discharge: 2021-06-04 | Payer: PRIVATE HEALTH INSURANCE

## 2021-06-04 DIAGNOSIS — M5116 Intervertebral disc disorders with radiculopathy, lumbar region: Principal | ICD-10-CM

## 2021-06-04 DIAGNOSIS — G4733 Obstructive sleep apnea (adult) (pediatric): Principal | ICD-10-CM

## 2021-06-04 DIAGNOSIS — M5126 Other intervertebral disc displacement, lumbar region: Principal | ICD-10-CM

## 2021-06-04 DIAGNOSIS — M4316 Spondylolisthesis, lumbar region: Principal | ICD-10-CM

## 2021-06-04 DIAGNOSIS — M48061 Spinal stenosis, lumbar region without neurogenic claudication: Principal | ICD-10-CM

## 2021-06-04 DIAGNOSIS — Z01818 Encounter for other preprocedural examination: Principal | ICD-10-CM

## 2021-06-04 DIAGNOSIS — Q762 Congenital spondylolisthesis: Principal | ICD-10-CM

## 2021-06-04 DIAGNOSIS — K21 Gastroesophageal reflux disease with esophagitis, unspecified whether hemorrhage: Principal | ICD-10-CM

## 2021-06-04 DIAGNOSIS — Z981 Arthrodesis status: Principal | ICD-10-CM

## 2021-06-04 DIAGNOSIS — Z8639 Personal history of other endocrine, nutritional and metabolic disease: Principal | ICD-10-CM

## 2021-06-04 DIAGNOSIS — M5416 Radiculopathy, lumbar region: Principal | ICD-10-CM

## 2021-06-04 DIAGNOSIS — I2510 Atherosclerotic heart disease of native coronary artery without angina pectoris: Principal | ICD-10-CM

## 2021-06-04 DIAGNOSIS — E039 Hypothyroidism, unspecified: Principal | ICD-10-CM

## 2021-06-04 DIAGNOSIS — I10 Essential (primary) hypertension: Principal | ICD-10-CM

## 2021-06-16 MED ORDER — CHOLECALCIFEROL (VITAMIN D3) 25 MCG (1,000 UNIT) TABLET
ORAL_TABLET | Freq: Every day | ORAL | 0 refills | 30.00000 days
Start: 2021-06-16 — End: 2021-07-16

## 2021-06-16 MED ORDER — POLYETHYLENE GLYCOL 3350 17 GRAM ORAL POWDER PACKET
PACK | Freq: Every day | ORAL | 0 refills | 7.00000 days
Start: 2021-06-16 — End: 2021-06-23

## 2021-06-16 MED ORDER — ONDANSETRON 4 MG DISINTEGRATING TABLET
ORAL_TABLET | Freq: Three times a day (TID) | ORAL | 0 refills | 7.00000 days | PRN
Start: 2021-06-16 — End: 2021-06-23

## 2021-06-16 MED ORDER — METHOCARBAMOL 750 MG TABLET
ORAL_TABLET | Freq: Four times a day (QID) | ORAL | 0 refills | 15.00000 days
Start: 2021-06-16 — End: 2021-07-01

## 2021-06-16 MED ORDER — OXYCODONE 10 MG TABLET
ORAL_TABLET | ORAL | 0 refills | 7.00000 days | PRN
Start: 2021-06-16 — End: 2021-06-21

## 2021-06-16 MED ORDER — CALCIUM CARBONATE 300 MG (750 MG) CHEWABLE TABLET
ORAL_TABLET | Freq: Two times a day (BID) | ORAL | 0 refills | 30.00000 days
Start: 2021-06-16 — End: 2021-07-16

## 2021-06-16 MED ORDER — ACETAMINOPHEN 500 MG TABLET
ORAL_TABLET | Freq: Three times a day (TID) | ORAL | 0 refills | 13.00000 days | PRN
Start: 2021-06-16 — End: 2021-07-07

## 2021-06-16 MED ORDER — SENNOSIDES 8.6 MG TABLET
ORAL_TABLET | Freq: Every evening | ORAL | 0 refills | 10.00000 days | PRN
Start: 2021-06-16 — End: 2021-07-16

## 2021-06-17 ENCOUNTER — Ambulatory Visit: Admit: 2021-06-17 | Discharge: 2021-06-18 | Payer: PRIVATE HEALTH INSURANCE | Attending: Family | Primary: Family

## 2021-06-17 ENCOUNTER — Encounter: Admit: 2021-06-17 | Discharge: 2021-06-17 | Payer: PRIVATE HEALTH INSURANCE

## 2021-06-17 DIAGNOSIS — S32009K Unspecified fracture of unspecified lumbar vertebra, subsequent encounter for fracture with nonunion: Principal | ICD-10-CM

## 2021-06-19 ENCOUNTER — Encounter
Admit: 2021-06-19 | Discharge: 2021-06-25 | Disposition: A | Discharge status: Home / Self Care | Payer: PRIVATE HEALTH INSURANCE | Attending: Student in an Organized Health Care Education/Training Program

## 2021-06-19 ENCOUNTER — Ambulatory Visit: Admit: 2021-06-19 | Discharge: 2021-06-25 | Payer: PRIVATE HEALTH INSURANCE

## 2021-06-19 ENCOUNTER — Encounter: Admit: 2021-06-19 | Discharge: 2021-06-25 | Payer: PRIVATE HEALTH INSURANCE | Attending: Anesthesiology

## 2021-06-19 ENCOUNTER — Ambulatory Visit
Admit: 2021-06-19 | Discharge: 2021-06-25 | Disposition: A | Discharge status: Home / Self Care | Payer: PRIVATE HEALTH INSURANCE

## 2021-06-19 ENCOUNTER — Ambulatory Visit: Disposition: A | Discharge status: Home / Self Care | Payer: PRIVATE HEALTH INSURANCE

## 2021-06-22 MED ORDER — ENOXAPARIN 40 MG/0.4 ML SUBCUTANEOUS SYRINGE
SUBCUTANEOUS | 0 refills | 28 days
Start: 2021-06-22 — End: 2021-07-20

## 2021-06-22 MED ORDER — SENNOSIDES 8.6 MG TABLET
ORAL_TABLET | Freq: Every evening | ORAL | 0 refills | 10 days | PRN
Start: 2021-06-22 — End: 2021-07-22

## 2021-06-22 MED ORDER — ACETAMINOPHEN 500 MG TABLET
ORAL_TABLET | Freq: Three times a day (TID) | ORAL | 0 refills | 13 days | PRN
Start: 2021-06-22 — End: 2021-07-13

## 2021-06-22 MED ORDER — CHOLECALCIFEROL (VITAMIN D3) 25 MCG (1,000 UNIT) TABLET
ORAL_TABLET | Freq: Every day | ORAL | 0 refills | 30 days
Start: 2021-06-22 — End: 2021-07-22

## 2021-06-22 MED ORDER — ONDANSETRON 4 MG DISINTEGRATING TABLET
ORAL_TABLET | Freq: Three times a day (TID) | ORAL | 0 refills | 7 days | PRN
Start: 2021-06-22 — End: 2021-06-29

## 2021-06-22 MED ORDER — METHOCARBAMOL 750 MG TABLET
ORAL_TABLET | Freq: Four times a day (QID) | ORAL | 0 refills | 15 days
Start: 2021-06-22 — End: 2021-07-07

## 2021-06-22 MED ORDER — CALCIUM CARBONATE 300 MG (750 MG) CHEWABLE TABLET
ORAL_TABLET | Freq: Two times a day (BID) | ORAL | 0 refills | 30 days
Start: 2021-06-22 — End: 2021-07-22

## 2021-06-22 MED ORDER — POLYETHYLENE GLYCOL 3350 17 GRAM ORAL POWDER PACKET
PACK | Freq: Every day | ORAL | 0 refills | 7 days
Start: 2021-06-22 — End: 2021-06-29

## 2021-06-22 MED ORDER — OXYCODONE 10 MG TABLET
ORAL_TABLET | ORAL | 0 refills | 7 days | PRN
Start: 2021-06-22 — End: 2021-06-27

## 2021-06-23 MED ORDER — ONDANSETRON 4 MG DISINTEGRATING TABLET
ORAL_TABLET | Freq: Three times a day (TID) | ORAL | 0 refills | 7 days | PRN
Start: 2021-06-23 — End: 2021-06-30

## 2021-06-23 MED ORDER — METHOCARBAMOL 750 MG TABLET
ORAL_TABLET | Freq: Four times a day (QID) | ORAL | 0 refills | 15 days
Start: 2021-06-23 — End: 2021-07-08

## 2021-06-23 MED ORDER — SENNOSIDES 8.6 MG TABLET
ORAL_TABLET | Freq: Every evening | ORAL | 0 refills | 10 days | PRN
Start: 2021-06-23 — End: 2021-07-23

## 2021-06-23 MED ORDER — ACETAMINOPHEN 500 MG TABLET
ORAL_TABLET | Freq: Three times a day (TID) | ORAL | 0 refills | 13 days | PRN
Start: 2021-06-23 — End: 2021-07-14

## 2021-06-23 MED ORDER — ENOXAPARIN 40 MG/0.4 ML SUBCUTANEOUS SYRINGE
SUBCUTANEOUS | 0 refills | 28 days
Start: 2021-06-23 — End: 2021-07-21

## 2021-06-23 MED ORDER — POLYETHYLENE GLYCOL 3350 17 GRAM ORAL POWDER PACKET
PACK | Freq: Every day | ORAL | 0 refills | 7 days
Start: 2021-06-23 — End: 2021-06-30

## 2021-06-23 MED ORDER — OXYCODONE 10 MG TABLET
ORAL_TABLET | ORAL | 0 refills | 7 days | PRN
Start: 2021-06-23 — End: 2021-06-28

## 2021-06-23 MED ORDER — CHOLECALCIFEROL (VITAMIN D3) 25 MCG (1,000 UNIT) TABLET
ORAL_TABLET | Freq: Every day | ORAL | 0 refills | 30 days
Start: 2021-06-23 — End: 2021-07-23

## 2021-06-23 MED ORDER — CALCIUM CARBONATE 300 MG (750 MG) CHEWABLE TABLET
ORAL_TABLET | Freq: Two times a day (BID) | ORAL | 0 refills | 30 days
Start: 2021-06-23 — End: 2021-07-23

## 2021-06-24 MED ORDER — CALCIUM CARBONATE 300 MG (750 MG) CHEWABLE TABLET
ORAL_TABLET | Freq: Two times a day (BID) | ORAL | 0 refills | 48 days | Status: CP
Start: 2021-06-24 — End: 2021-08-11
  Filled 2021-06-24: qty 96, 48d supply, fill #0

## 2021-06-24 MED ORDER — ACETAMINOPHEN 500 MG TABLET
ORAL_TABLET | Freq: Three times a day (TID) | ORAL | 0 refills | 13 days | Status: CP | PRN
Start: 2021-06-24 — End: 2021-07-15
  Filled 2021-06-24: qty 75, 13d supply, fill #0

## 2021-06-24 MED ORDER — ONDANSETRON 4 MG DISINTEGRATING TABLET
ORAL_TABLET | Freq: Three times a day (TID) | ORAL | 0 refills | 7 days | Status: CP | PRN
Start: 2021-06-24 — End: 2021-07-01
  Filled 2021-06-24: qty 20, 7d supply, fill #0

## 2021-06-24 MED ORDER — OXYCODONE 5 MG TABLET
ORAL_TABLET | ORAL | 0 refills | 2 days | Status: CP | PRN
Start: 2021-06-24 — End: 2021-06-29

## 2021-06-24 MED ORDER — ENOXAPARIN 40 MG/0.4 ML SUBCUTANEOUS SYRINGE
SUBCUTANEOUS | 0 refills | 28 days | Status: CP
Start: 2021-06-24 — End: 2021-07-22
  Filled 2021-06-24: qty 11.2, 28d supply, fill #0

## 2021-06-24 MED ORDER — SENNOSIDES 8.6 MG TABLET
ORAL_TABLET | Freq: Every evening | ORAL | 0 refills | 10 days | Status: CP | PRN
Start: 2021-06-24 — End: 2021-07-24
  Filled 2021-06-24: qty 20, 10d supply, fill #0

## 2021-06-24 MED ORDER — POLYETHYLENE GLYCOL 3350 17 GRAM/DOSE ORAL POWDER
Freq: Every day | ORAL | 0 refills | 14 days | Status: CP
Start: 2021-06-24 — End: 2021-07-08
  Filled 2021-06-24: qty 238, 14d supply, fill #0

## 2021-06-24 MED ORDER — CHOLECALCIFEROL (VITAMIN D3) 25 MCG (1,000 UNIT) TABLET
ORAL_TABLET | Freq: Every day | ORAL | 0 refills | 100 days | Status: CP
Start: 2021-06-24 — End: 2021-10-02
  Filled 2021-06-24: qty 100, 100d supply, fill #0

## 2021-06-24 MED ORDER — METHOCARBAMOL 750 MG TABLET
ORAL_TABLET | Freq: Four times a day (QID) | ORAL | 0 refills | 15 days | Status: CP
Start: 2021-06-24 — End: 2021-07-09
  Filled 2021-06-24: qty 60, 15d supply, fill #0

## 2021-06-28 DIAGNOSIS — S32009K Unspecified fracture of unspecified lumbar vertebra, subsequent encounter for fracture with nonunion: Principal | ICD-10-CM

## 2021-06-28 MED ORDER — OXYCODONE 5 MG TABLET
ORAL_TABLET | ORAL | 0 refills | 1 days | Status: CP | PRN
Start: 2021-06-28 — End: 2021-07-03

## 2021-07-09 MED ORDER — METHOCARBAMOL 750 MG TABLET
ORAL_TABLET | Freq: Four times a day (QID) | ORAL | 0 refills | 15 days | Status: CP
Start: 2021-07-09 — End: 2021-07-24

## 2021-07-09 MED ORDER — METHOCARBAMOL 500 MG TABLET
ORAL_TABLET | Freq: Three times a day (TID) | ORAL | 0 refills | 5 days | Status: CP
Start: 2021-07-09 — End: 2021-07-14

## 2021-07-09 MED ORDER — OXYCODONE 5 MG TABLET
ORAL_TABLET | ORAL | 0 refills | 7 days | Status: CP | PRN
Start: 2021-07-09 — End: 2021-07-16

## 2021-07-30 ENCOUNTER — Ambulatory Visit: Admit: 2021-07-30 | Discharge: 2021-07-30 | Payer: PRIVATE HEALTH INSURANCE

## 2021-07-30 DIAGNOSIS — Z981 Arthrodesis status: Principal | ICD-10-CM

## 2021-08-02 DIAGNOSIS — S32009K Unspecified fracture of unspecified lumbar vertebra, subsequent encounter for fracture with nonunion: Principal | ICD-10-CM

## 2021-08-02 DIAGNOSIS — Z981 Arthrodesis status: Principal | ICD-10-CM

## 2021-08-02 DIAGNOSIS — M48062 Spinal stenosis, lumbar region with neurogenic claudication: Principal | ICD-10-CM

## 2021-08-12 ENCOUNTER — Ambulatory Visit: Admit: 2021-08-12 | Payer: PRIVATE HEALTH INSURANCE

## 2021-08-12 ENCOUNTER — Ambulatory Visit
Admit: 2021-08-12 | Payer: PRIVATE HEALTH INSURANCE | Attending: Rehabilitative and Restorative Service Providers" | Primary: Rehabilitative and Restorative Service Providers"

## 2021-08-12 ENCOUNTER — Ambulatory Visit: Admit: 2021-08-12 | Discharge: 2021-09-10 | Payer: PRIVATE HEALTH INSURANCE

## 2021-08-20 IMAGING — CR DG HIP (WITH OR WITHOUT PELVIS) 2-3V*L*
3 series · 3 of 3 positions shown · non-contrast
Comparison: None.

CLINICAL DATA: Left hip pain after fall.

EXAM:
DG HIP (WITH OR WITHOUT PELVIS) 2-3V LEFT

[pelvis ap]
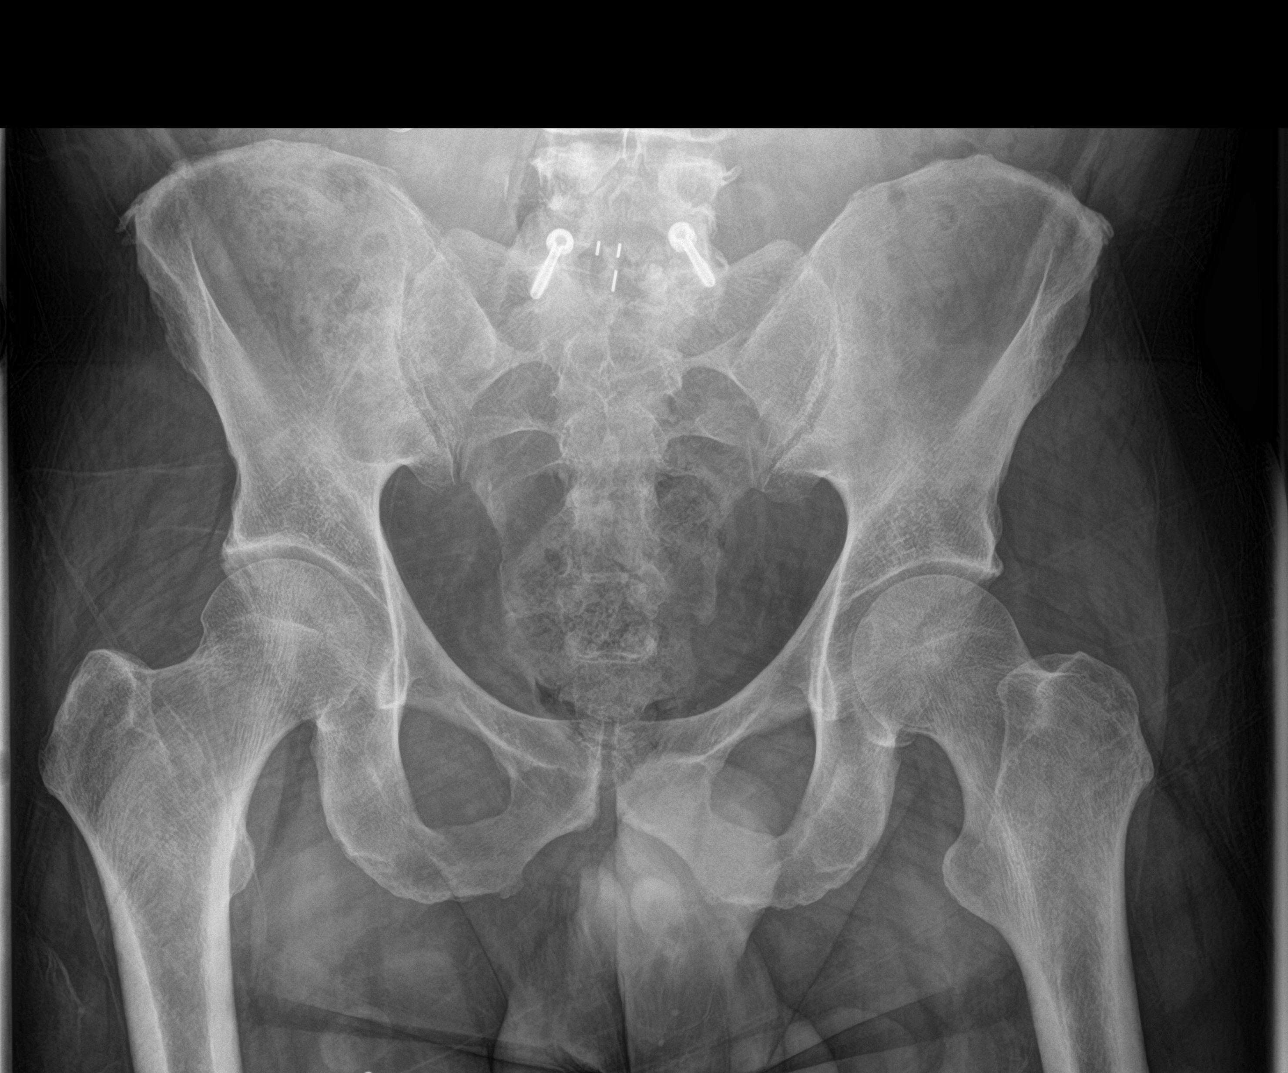

[hip ap]
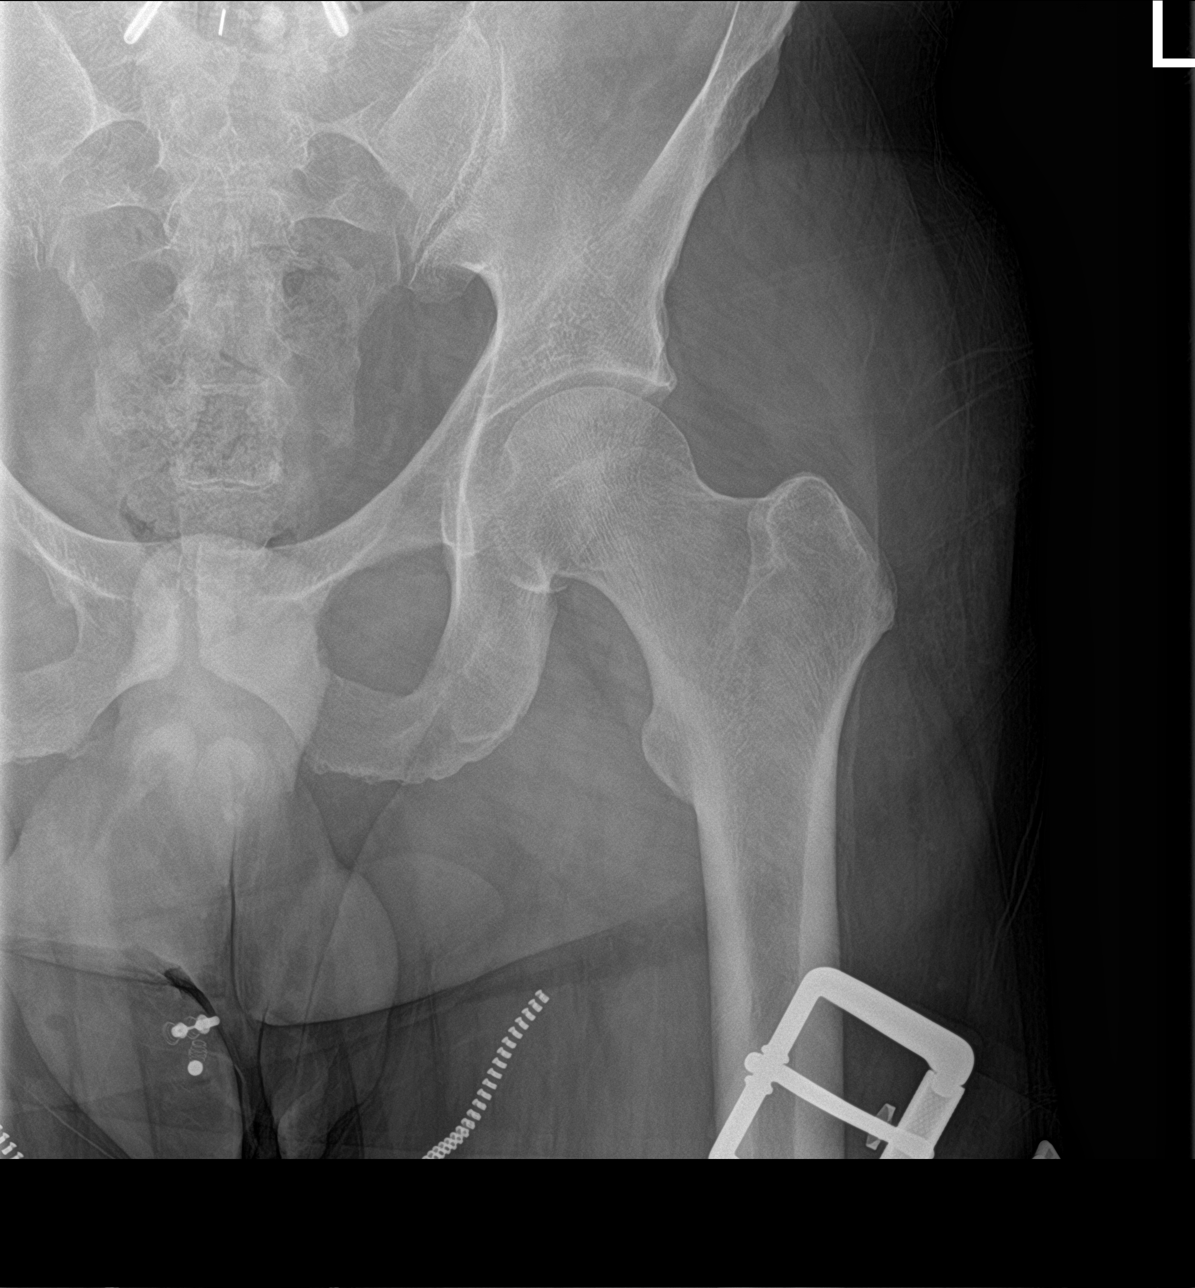

[hip lat]
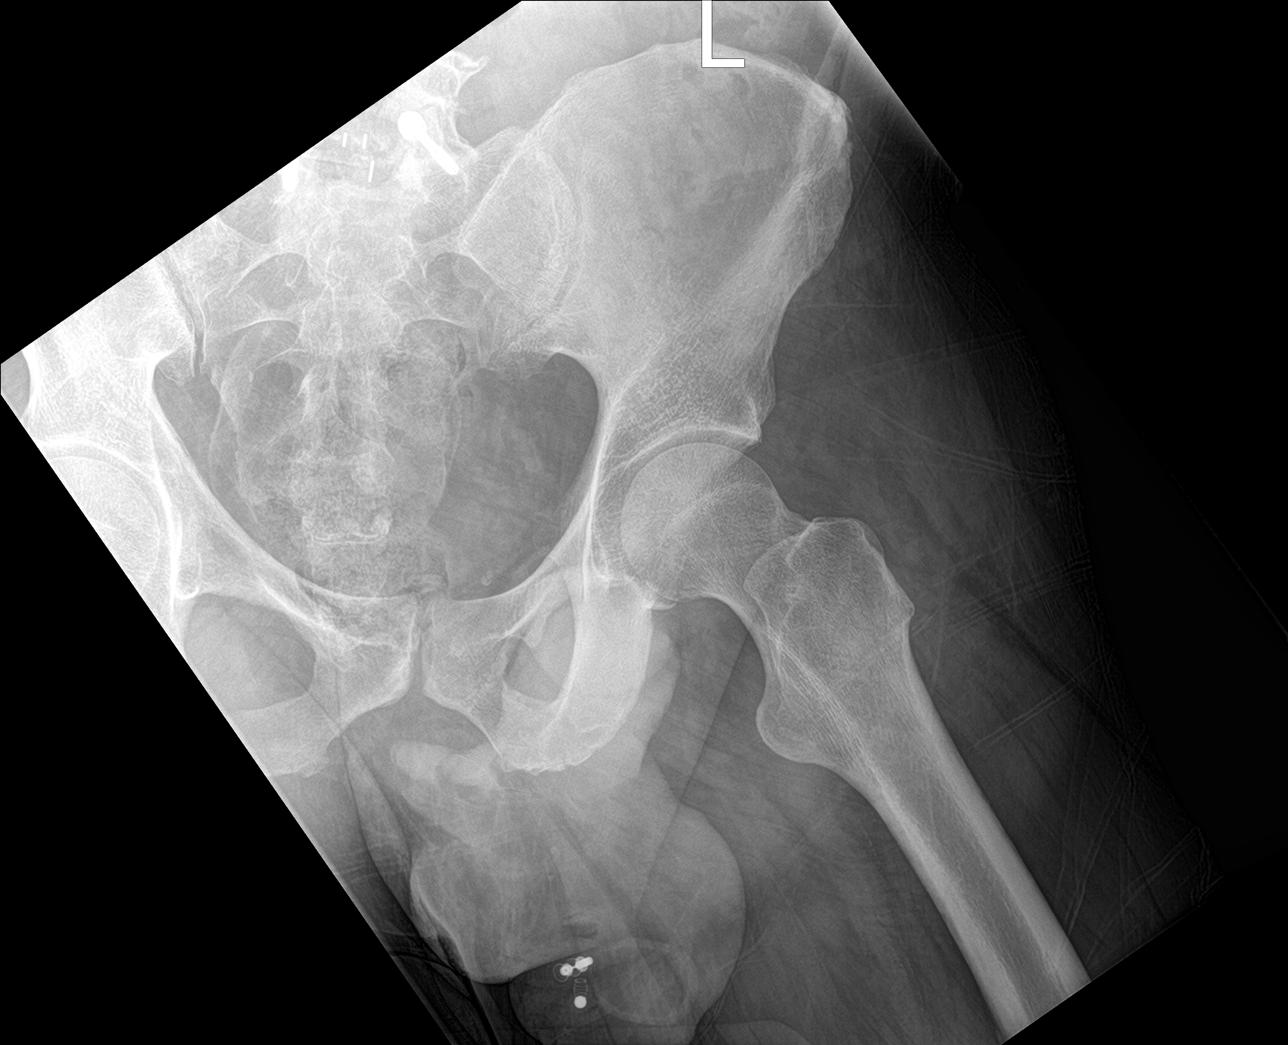

[3 of 3 positions shown; findings below may reference images not displayed]

FINDINGS: There is no evidence of hip fracture or dislocation. There is no
evidence of arthropathy or other focal bone abnormality.
IMPRESSION: Negative.

## 2021-09-25 ENCOUNTER — Ambulatory Visit: Admit: 2021-09-25 | Payer: PRIVATE HEALTH INSURANCE

## 2021-10-01 ENCOUNTER — Ambulatory Visit: Admit: 2021-09-25 | Payer: PRIVATE HEALTH INSURANCE

## 2021-10-01 ENCOUNTER — Ambulatory Visit: Admit: 2021-10-01 | Payer: PRIVATE HEALTH INSURANCE

## 2021-10-08 ENCOUNTER — Ambulatory Visit: Admit: 2021-10-08 | Discharge: 2021-10-10 | Payer: PRIVATE HEALTH INSURANCE

## 2021-10-08 ENCOUNTER — Ambulatory Visit: Admit: 2021-10-01 | Payer: PRIVATE HEALTH INSURANCE

## 2021-10-08 ENCOUNTER — Ambulatory Visit
Admit: 2021-10-08 | Discharge: 2021-10-10 | Payer: PRIVATE HEALTH INSURANCE | Attending: Rehabilitative and Restorative Service Providers" | Primary: Rehabilitative and Restorative Service Providers"

## 2021-12-17 ENCOUNTER — Ambulatory Visit: Admit: 2021-12-17 | Discharge: 2021-12-18 | Payer: PRIVATE HEALTH INSURANCE

## 2021-12-17 DIAGNOSIS — M545 Lumbar back pain: Principal | ICD-10-CM

## 2022-03-17 ENCOUNTER — Ambulatory Visit: Admit: 2022-03-17 | Discharge: 2022-03-18 | Payer: PRIVATE HEALTH INSURANCE

## 2022-03-17 MED ORDER — SCOPOLAMINE 1 MG OVER 3 DAYS TRANSDERMAL PATCH
MEDICATED_PATCH | TRANSDERMAL | 1 refills | 12 days | Status: CP
Start: 2022-03-17 — End: 2023-03-17

## 2022-03-17 MED ORDER — OMEPRAZOLE 20 MG CAPSULE,DELAYED RELEASE
ORAL_CAPSULE | Freq: Every day | ORAL | 0 refills | 90 days | Status: CP
Start: 2022-03-17 — End: ?

## 2022-03-17 MED ORDER — EZETIMIBE 10 MG TABLET
ORAL_TABLET | Freq: Every day | ORAL | 3 refills | 90 days | Status: CP
Start: 2022-03-17 — End: 2023-03-17

## 2022-03-17 MED ORDER — LEVOTHYROXINE 112 MCG TABLET
ORAL_TABLET | Freq: Every day | ORAL | 3 refills | 90 days | Status: CP
Start: 2022-03-17 — End: 2023-03-17

## 2022-03-17 MED ORDER — CARVEDILOL 25 MG TABLET
ORAL_TABLET | Freq: Two times a day (BID) | ORAL | 3 refills | 90 days | Status: CP
Start: 2022-03-17 — End: ?

## 2022-03-17 MED ORDER — TADALAFIL 5 MG TABLET
ORAL_TABLET | Freq: Every day | ORAL | 3 refills | 90 days | Status: CP
Start: 2022-03-17 — End: 2023-03-12

## 2022-03-17 MED ORDER — LISINOPRIL 10 MG TABLET
ORAL_TABLET | Freq: Every day | ORAL | 0 refills | 45 days | Status: CP
Start: 2022-03-17 — End: ?

## 2022-06-15 DIAGNOSIS — K219 Gastro-esophageal reflux disease without esophagitis: Principal | ICD-10-CM

## 2022-06-15 MED ORDER — OMEPRAZOLE 20 MG CAPSULE,DELAYED RELEASE
ORAL_CAPSULE | Freq: Every day | ORAL | 0 refills | 0 days
Start: 2022-06-15 — End: ?

## 2022-06-17 MED ORDER — OMEPRAZOLE 20 MG CAPSULE,DELAYED RELEASE
ORAL_CAPSULE | Freq: Every day | ORAL | 0 refills | 90 days | Status: CP
Start: 2022-06-17 — End: ?

## 2022-09-29 DIAGNOSIS — I10 Essential (primary) hypertension: Principal | ICD-10-CM

## 2022-09-29 MED ORDER — LISINOPRIL 10 MG TABLET
ORAL_TABLET | Freq: Every day | ORAL | 0 refills | 0 days
Start: 2022-09-29 — End: ?

## 2022-09-30 DIAGNOSIS — I10 Essential (primary) hypertension: Principal | ICD-10-CM

## 2022-09-30 MED ORDER — LISINOPRIL 10 MG TABLET
ORAL_TABLET | Freq: Every day | ORAL | 0 refills | 45.00000 days | Status: CP
Start: 2022-09-30 — End: ?

## 2022-11-10 DIAGNOSIS — I10 Essential (primary) hypertension: Principal | ICD-10-CM

## 2022-11-10 MED ORDER — LISINOPRIL 10 MG TABLET
ORAL_TABLET | Freq: Every day | ORAL | 0 refills | 0 days
Start: 2022-11-10 — End: ?

## 2022-11-11 MED ORDER — LISINOPRIL 10 MG TABLET
ORAL_TABLET | Freq: Every day | ORAL | 1 refills | 45 days | Status: CP
Start: 2022-11-11 — End: ?

## 2023-01-13 ENCOUNTER — Ambulatory Visit: Admit: 2023-01-13 | Discharge: 2023-01-14 | Payer: PRIVATE HEALTH INSURANCE

## 2023-01-13 DIAGNOSIS — S32009K Unspecified fracture of unspecified lumbar vertebra, subsequent encounter for fracture with nonunion: Principal | ICD-10-CM

## 2023-01-23 ENCOUNTER — Ambulatory Visit: Admit: 2023-01-23 | Discharge: 2023-01-24 | Payer: PRIVATE HEALTH INSURANCE

## 2023-01-29 ENCOUNTER — Institutional Professional Consult (permissible substitution): Admit: 2023-01-29 | Discharge: 2023-01-30 | Payer: PRIVATE HEALTH INSURANCE | Attending: Surgery | Primary: Surgery

## 2023-01-29 DIAGNOSIS — Z1211 Encounter for screening for malignant neoplasm of colon: Principal | ICD-10-CM

## 2023-01-29 MED ORDER — BISACODYL 5 MG TABLET,DELAYED RELEASE
ORAL_TABLET | 0 refills | 0 days | Status: CP
Start: 2023-01-29 — End: ?

## 2023-01-29 MED ORDER — PEG 3350-ELECTROLYTES 236 GRAM-22.74 GRAM-6.74 GRAM-5.86 GRAM SOLUTION
Freq: Once | ORAL | 0 refills | 1 days | Status: CP
Start: 2023-01-29 — End: 2023-01-29

## 2023-02-10 ENCOUNTER — Ambulatory Visit
Admit: 2023-02-10 | Discharge: 2023-02-11 | Payer: PRIVATE HEALTH INSURANCE | Attending: Sports Medicine | Primary: Sports Medicine

## 2023-02-10 MED ORDER — FERROUS SULFATE 325 MG (65 MG IRON) TABLET
ORAL_TABLET | ORAL | 3 refills | 60 days | Status: CP
Start: 2023-02-10 — End: 2024-02-10

## 2023-02-10 MED ORDER — NIFEDIPINE 10 MG CAPSULE
ORAL_CAPSULE | Freq: Three times a day (TID) | ORAL | 0 refills | 30 days | Status: CP
Start: 2023-02-10 — End: 2023-03-12

## 2023-02-11 DIAGNOSIS — M79641 Pain in right hand: Principal | ICD-10-CM

## 2023-02-11 DIAGNOSIS — I70208 Unspecified atherosclerosis of native arteries of extremities, other extremity: Principal | ICD-10-CM

## 2023-02-16 ENCOUNTER — Ambulatory Visit: Admit: 2023-02-16 | Discharge: 2023-02-17 | Payer: PRIVATE HEALTH INSURANCE

## 2023-03-02 ENCOUNTER — Ambulatory Visit
Admit: 2023-03-02 | Discharge: 2023-03-03 | Payer: PRIVATE HEALTH INSURANCE | Attending: Sports Medicine | Primary: Sports Medicine

## 2023-03-02 MED ORDER — HYDROXYZINE HCL 25 MG TABLET
ORAL_TABLET | Freq: Three times a day (TID) | ORAL | 11 refills | 30 days | Status: CP | PRN
Start: 2023-03-02 — End: 2024-03-01

## 2023-03-03 DIAGNOSIS — D509 Iron deficiency anemia, unspecified: Principal | ICD-10-CM

## 2023-03-06 ENCOUNTER — Encounter
Admit: 2023-03-06 | Discharge: 2023-03-06 | Payer: PRIVATE HEALTH INSURANCE | Attending: Anesthesiology | Primary: Anesthesiology

## 2023-03-06 ENCOUNTER — Ambulatory Visit: Admit: 2023-03-06 | Discharge: 2023-03-06 | Payer: PRIVATE HEALTH INSURANCE

## 2023-03-06 DIAGNOSIS — Z139 Encounter for screening, unspecified: Principal | ICD-10-CM

## 2023-03-06 MED ORDER — SUCRALFATE 100 MG/ML ORAL SUSPENSION
Freq: Four times a day (QID) | ORAL | 0 refills | 11 days | Status: CP
Start: 2023-03-06 — End: 2023-04-05

## 2023-03-06 MED ORDER — OMEPRAZOLE 40 MG CAPSULE,DELAYED RELEASE
ORAL_CAPSULE | Freq: Two times a day (BID) | ORAL | 2 refills | 30 days | Status: CP
Start: 2023-03-06 — End: 2023-06-04

## 2023-03-19 ENCOUNTER — Ambulatory Visit: Admit: 2023-03-19 | Discharge: 2023-03-20 | Payer: PRIVATE HEALTH INSURANCE

## 2023-03-22 DIAGNOSIS — I10 Essential (primary) hypertension: Principal | ICD-10-CM

## 2023-03-22 DIAGNOSIS — I2510 Atherosclerotic heart disease of native coronary artery without angina pectoris: Principal | ICD-10-CM

## 2023-03-22 DIAGNOSIS — K219 Gastro-esophageal reflux disease without esophagitis: Principal | ICD-10-CM

## 2023-03-22 DIAGNOSIS — E039 Hypothyroidism, unspecified: Principal | ICD-10-CM

## 2023-03-22 DIAGNOSIS — N529 Male erectile dysfunction, unspecified: Principal | ICD-10-CM

## 2023-03-22 DIAGNOSIS — E785 Hyperlipidemia, unspecified: Principal | ICD-10-CM

## 2023-03-22 DIAGNOSIS — E291 Testicular hypofunction: Principal | ICD-10-CM

## 2023-03-22 MED ORDER — LISINOPRIL 10 MG TABLET
ORAL_TABLET | Freq: Every day | ORAL | 0 refills | 0 days
Start: 2023-03-22 — End: ?

## 2023-03-22 MED ORDER — TADALAFIL 5 MG TABLET
ORAL_TABLET | Freq: Every morning | ORAL | 0 refills | 0 days
Start: 2023-03-22 — End: ?

## 2023-03-22 MED ORDER — LEVOTHYROXINE 112 MCG TABLET
ORAL_TABLET | Freq: Every day | ORAL | 0 refills | 0 days
Start: 2023-03-22 — End: ?

## 2023-03-22 MED ORDER — EZETIMIBE 10 MG TABLET
ORAL_TABLET | Freq: Every day | ORAL | 0 refills | 0 days
Start: 2023-03-22 — End: ?

## 2023-03-22 MED ORDER — CARVEDILOL 25 MG TABLET
ORAL_TABLET | ORAL | 0 refills | 0 days
Start: 2023-03-22 — End: ?

## 2023-03-22 MED ORDER — OMEPRAZOLE 20 MG CAPSULE,DELAYED RELEASE
ORAL_CAPSULE | Freq: Every day | ORAL | 0 refills | 0 days
Start: 2023-03-22 — End: ?

## 2023-03-24 MED ORDER — OMEPRAZOLE 20 MG CAPSULE,DELAYED RELEASE
ORAL_CAPSULE | Freq: Every day | ORAL | 0 refills | 90 days | Status: CP
Start: 2023-03-24 — End: ?

## 2023-03-24 MED ORDER — LEVOTHYROXINE 112 MCG TABLET
ORAL_TABLET | Freq: Every day | ORAL | 0 refills | 90 days | Status: CP
Start: 2023-03-24 — End: ?

## 2023-03-24 MED ORDER — TADALAFIL 5 MG TABLET
ORAL_TABLET | Freq: Every morning | ORAL | 0 refills | 90 days | Status: CP
Start: 2023-03-24 — End: ?

## 2023-03-24 MED ORDER — LISINOPRIL 10 MG TABLET
ORAL_TABLET | Freq: Every day | ORAL | 0 refills | 90 days | Status: CP
Start: 2023-03-24 — End: ?

## 2023-03-24 MED ORDER — CARVEDILOL 25 MG TABLET
ORAL_TABLET | ORAL | 0 refills | 0 days | Status: CP
Start: 2023-03-24 — End: ?

## 2023-03-24 MED ORDER — EZETIMIBE 10 MG TABLET
ORAL_TABLET | Freq: Every day | ORAL | 0 refills | 90 days | Status: CP
Start: 2023-03-24 — End: ?

## 2023-04-01 MED ORDER — OMEPRAZOLE 40 MG CAPSULE,DELAYED RELEASE
ORAL_CAPSULE | Freq: Two times a day (BID) | ORAL | 1 refills | 30 days | Status: CP
Start: 2023-04-01 — End: 2023-06-30

## 2023-04-13 ENCOUNTER — Ambulatory Visit
Admit: 2023-04-13 | Discharge: 2023-04-14 | Payer: PRIVATE HEALTH INSURANCE | Attending: Vascular Surgery | Primary: Vascular Surgery

## 2023-04-13 DIAGNOSIS — I70208 Unspecified atherosclerosis of native arteries of extremities, other extremity: Principal | ICD-10-CM

## 2023-04-16 MED ORDER — BISACODYL 5 MG TABLET,DELAYED RELEASE
ORAL_TABLET | 0 refills | 0.00 days | Status: CP
Start: 2023-04-16 — End: ?

## 2023-04-16 MED ORDER — PEG 3350-ELECTROLYTES 236 GRAM-22.74 GRAM-6.74 GRAM-5.86 GRAM SOLUTION
Freq: Once | ORAL | 0 refills | 1.00 days | Status: CP
Start: 2023-04-16 — End: 2023-04-16

## 2023-04-21 ENCOUNTER — Ambulatory Visit
Admit: 2023-04-21 | Discharge: 2023-04-22 | Payer: PRIVATE HEALTH INSURANCE | Attending: Sports Medicine | Primary: Sports Medicine

## 2023-04-21 MED ORDER — LISINOPRIL 10 MG TABLET
ORAL_TABLET | Freq: Every day | ORAL | 3 refills | 90.00 days | Status: CP
Start: 2023-04-21 — End: 2024-04-20

## 2023-04-21 MED ORDER — LEVOTHYROXINE 112 MCG TABLET
ORAL_TABLET | Freq: Every day | ORAL | 3 refills | 90.00 days | Status: CP
Start: 2023-04-21 — End: 2024-04-20

## 2023-04-21 MED ORDER — NIFEDIPINE 10 MG CAPSULE
ORAL_CAPSULE | 3 refills | 0.00 days | Status: CP
Start: 2023-04-21 — End: ?

## 2023-04-21 MED ORDER — EZETIMIBE 10 MG TABLET
ORAL_TABLET | Freq: Every day | ORAL | 3 refills | 90.00 days | Status: CP
Start: 2023-04-21 — End: 2024-04-20

## 2023-04-21 MED ORDER — CARVEDILOL 25 MG TABLET
ORAL_TABLET | Freq: Two times a day (BID) | ORAL | 3 refills | 90.00 days | Status: CP
Start: 2023-04-21 — End: 2024-04-20

## 2023-04-21 MED ORDER — BISACODYL 5 MG TABLET,DELAYED RELEASE
ORAL_TABLET | 0 refills | 0.00 days | Status: CP
Start: 2023-04-21 — End: ?

## 2023-04-21 MED ORDER — TADALAFIL 5 MG TABLET
ORAL_TABLET | Freq: Every morning | ORAL | 3 refills | 90.00 days | Status: CP
Start: 2023-04-21 — End: 2024-04-20

## 2023-04-23 DIAGNOSIS — D509 Iron deficiency anemia, unspecified: Principal | ICD-10-CM

## 2023-05-01 ENCOUNTER — Encounter
Admit: 2023-05-01 | Discharge: 2023-05-01 | Payer: PRIVATE HEALTH INSURANCE | Attending: Anesthesiology | Primary: Anesthesiology

## 2023-05-01 ENCOUNTER — Inpatient Hospital Stay: Admit: 2023-05-01 | Discharge: 2023-05-01 | Payer: PRIVATE HEALTH INSURANCE

## 2023-05-01 DIAGNOSIS — K253 Acute gastric ulcer without hemorrhage or perforation: Principal | ICD-10-CM

## 2023-05-01 MED ORDER — SUCRALFATE 100 MG/ML ORAL SUSPENSION
Freq: Four times a day (QID) | ORAL | 0 refills | 11.00 days | Status: CP
Start: 2023-05-01 — End: 2023-05-31

## 2023-05-08 DIAGNOSIS — G4733 Obstructive sleep apnea (adult) (pediatric): Principal | ICD-10-CM

## 2023-05-11 ENCOUNTER — Ambulatory Visit
Admit: 2023-05-11 | Discharge: 2023-05-12 | Payer: PRIVATE HEALTH INSURANCE | Attending: Adult Health | Primary: Adult Health

## 2023-05-11 DIAGNOSIS — N400 Enlarged prostate without lower urinary tract symptoms: Principal | ICD-10-CM

## 2023-05-11 DIAGNOSIS — N529 Male erectile dysfunction, unspecified: Principal | ICD-10-CM

## 2023-05-11 DIAGNOSIS — E291 Testicular hypofunction: Principal | ICD-10-CM

## 2023-05-12 ENCOUNTER — Ambulatory Visit: Admit: 2023-05-12 | Payer: PRIVATE HEALTH INSURANCE

## 2023-05-12 DIAGNOSIS — E291 Testicular hypofunction: Principal | ICD-10-CM

## 2023-05-25 ENCOUNTER — Ambulatory Visit: Admit: 2023-05-25 | Discharge: 2023-05-26 | Payer: PRIVATE HEALTH INSURANCE

## 2023-05-25 ENCOUNTER — Inpatient Hospital Stay: Admit: 2023-05-25 | Discharge: 2023-05-26 | Payer: PRIVATE HEALTH INSURANCE

## 2023-05-25 DIAGNOSIS — F419 Anxiety disorder, unspecified: Principal | ICD-10-CM

## 2023-05-25 DIAGNOSIS — D509 Iron deficiency anemia, unspecified: Principal | ICD-10-CM

## 2023-05-25 MED ORDER — ONDANSETRON 4 MG DISINTEGRATING TABLET
ORAL_TABLET | Freq: Three times a day (TID) | 0 refills | 5.00 days | Status: CP | PRN
Start: 2023-05-25 — End: 2023-06-01

## 2023-05-28 ENCOUNTER — Ambulatory Visit
Admit: 2023-05-28 | Discharge: 2023-05-29 | Payer: PRIVATE HEALTH INSURANCE | Attending: Student in an Organized Health Care Education/Training Program | Primary: Student in an Organized Health Care Education/Training Program

## 2023-05-29 DIAGNOSIS — D508 Other iron deficiency anemias: Principal | ICD-10-CM

## 2023-05-31 DIAGNOSIS — D509 Iron deficiency anemia, unspecified: Principal | ICD-10-CM

## 2023-06-05 DIAGNOSIS — D509 Iron deficiency anemia, unspecified: Principal | ICD-10-CM

## 2023-06-10 ENCOUNTER — Ambulatory Visit: Admit: 2023-06-10 | Discharge: 2023-06-11 | Payer: PRIVATE HEALTH INSURANCE

## 2023-06-10 DIAGNOSIS — I10 Essential (primary) hypertension: Principal | ICD-10-CM

## 2023-06-10 DIAGNOSIS — R079 Chest pain, unspecified: Principal | ICD-10-CM

## 2023-06-10 DIAGNOSIS — I2510 Atherosclerotic heart disease of native coronary artery without angina pectoris: Principal | ICD-10-CM

## 2023-06-29 ENCOUNTER — Inpatient Hospital Stay: Admit: 2023-06-29 | Discharge: 2023-06-30 | Payer: PRIVATE HEALTH INSURANCE

## 2023-06-29 DIAGNOSIS — D509 Iron deficiency anemia, unspecified: Principal | ICD-10-CM

## 2023-06-30 ENCOUNTER — Inpatient Hospital Stay: Admit: 2023-06-30 | Discharge: 2023-07-02 | Payer: PRIVATE HEALTH INSURANCE

## 2023-07-03 ENCOUNTER — Inpatient Hospital Stay: Admit: 2023-07-03 | Discharge: 2023-07-03 | Payer: PRIVATE HEALTH INSURANCE

## 2023-07-03 MED ORDER — OMEPRAZOLE 40 MG CAPSULE,DELAYED RELEASE
ORAL_CAPSULE | Freq: Two times a day (BID) | ORAL | 0 refills | 30.00 days | Status: CP
Start: 2023-07-03 — End: 2023-08-02

## 2023-07-27 ENCOUNTER — Ambulatory Visit: Admit: 2023-07-27 | Discharge: 2023-07-28

## 2023-07-27 DIAGNOSIS — N400 Enlarged prostate without lower urinary tract symptoms: Principal | ICD-10-CM

## 2023-08-12 DIAGNOSIS — K219 Gastro-esophageal reflux disease without esophagitis: Principal | ICD-10-CM

## 2023-08-12 MED ORDER — OMEPRAZOLE 20 MG CAPSULE,DELAYED RELEASE
ORAL_CAPSULE | Freq: Every day | ORAL | 0 refills | 0.00 days
Start: 2023-08-12 — End: ?

## 2023-08-13 DIAGNOSIS — K21 Gastroesophageal reflux disease with esophagitis, unspecified whether hemorrhage: Principal | ICD-10-CM

## 2023-08-13 DIAGNOSIS — K253 Acute gastric ulcer without hemorrhage or perforation: Principal | ICD-10-CM

## 2023-08-13 MED ORDER — OMEPRAZOLE 20 MG CAPSULE,DELAYED RELEASE
ORAL_CAPSULE | Freq: Every day | ORAL | 0 refills | 90.00 days
Start: 2023-08-13 — End: ?

## 2023-08-13 MED ORDER — OMEPRAZOLE 40 MG CAPSULE,DELAYED RELEASE
ORAL_CAPSULE | Freq: Two times a day (BID) | ORAL | 3 refills | 100.00 days | Status: CP
Start: 2023-08-13 — End: 2024-09-16

## 2023-08-24 ENCOUNTER — Ambulatory Visit
Admit: 2023-08-24 | Discharge: 2023-08-25 | Payer: PRIVATE HEALTH INSURANCE | Attending: Sports Medicine | Primary: Sports Medicine

## 2023-08-24 MED ORDER — OMEPRAZOLE 40 MG CAPSULE,DELAYED RELEASE
ORAL_CAPSULE | Freq: Two times a day (BID) | ORAL | 0 refills | 30.00000 days | Status: CP
Start: 2023-08-24 — End: 2023-08-24

## 2023-08-24 MED ORDER — PANTOPRAZOLE 40 MG TABLET,DELAYED RELEASE
ORAL_TABLET | Freq: Every day | ORAL | 1 refills | 30.00000 days | Status: CP
Start: 2023-08-24 — End: 2024-08-23

## 2023-08-26 ENCOUNTER — Encounter: Admit: 2023-08-26 | Discharge: 2023-08-27 | Payer: PRIVATE HEALTH INSURANCE

## 2023-09-10 DIAGNOSIS — G4733 Obstructive sleep apnea (adult) (pediatric): Principal | ICD-10-CM

## 2023-09-23 ENCOUNTER — Ambulatory Visit: Admit: 2023-09-23 | Discharge: 2023-09-23 | Payer: PRIVATE HEALTH INSURANCE

## 2023-10-02 DIAGNOSIS — K253 Acute gastric ulcer without hemorrhage or perforation: Principal | ICD-10-CM

## 2023-10-02 DIAGNOSIS — R1310 Dysphagia, unspecified: Principal | ICD-10-CM

## 2023-10-02 DIAGNOSIS — E164 Increased secretion of gastrin: Principal | ICD-10-CM

## 2023-11-09 ENCOUNTER — Ambulatory Visit
Admit: 2023-11-09 | Discharge: 2023-11-10 | Payer: PRIVATE HEALTH INSURANCE | Attending: Vascular Surgery | Primary: Vascular Surgery

## 2023-11-09 ENCOUNTER — Ambulatory Visit: Admit: 2023-11-09 | Discharge: 2023-11-10 | Payer: PRIVATE HEALTH INSURANCE

## 2023-11-09 DIAGNOSIS — D508 Other iron deficiency anemias: Principal | ICD-10-CM

## 2023-11-09 DIAGNOSIS — N1831 Stage 3a chronic kidney disease (CMS-HCC): Principal | ICD-10-CM

## 2023-11-09 DIAGNOSIS — I2510 Atherosclerotic heart disease of native coronary artery without angina pectoris: Principal | ICD-10-CM

## 2023-11-09 DIAGNOSIS — K21 Gastroesophageal reflux disease with esophagitis, unspecified whether hemorrhage: Principal | ICD-10-CM

## 2023-11-09 DIAGNOSIS — I10 Essential (primary) hypertension: Principal | ICD-10-CM

## 2023-11-09 DIAGNOSIS — N400 Enlarged prostate without lower urinary tract symptoms: Principal | ICD-10-CM

## 2023-11-09 DIAGNOSIS — E039 Hypothyroidism, unspecified: Principal | ICD-10-CM

## 2023-11-09 DIAGNOSIS — Z01818 Encounter for other preprocedural examination: Principal | ICD-10-CM

## 2023-11-17 ENCOUNTER — Inpatient Hospital Stay: Admit: 2023-11-17 | Discharge: 2023-11-17 | Payer: PRIVATE HEALTH INSURANCE

## 2023-11-17 ENCOUNTER — Encounter
Admit: 2023-11-17 | Discharge: 2023-11-17 | Payer: PRIVATE HEALTH INSURANCE | Attending: Anesthesiology | Primary: Anesthesiology

## 2023-11-17 ENCOUNTER — Ambulatory Visit: Admit: 2023-11-17 | Discharge: 2023-11-17 | Payer: PRIVATE HEALTH INSURANCE

## 2023-11-17 MED ORDER — DIAZEPAM 5 MG TABLET
ORAL_TABLET | Freq: Once | ORAL | 0 refills | 0.00000 days | Status: CP | PRN
Start: 2023-11-17 — End: ?
  Filled 2023-11-17: qty 10, 10d supply, fill #0

## 2023-11-17 MED ORDER — LISINOPRIL 10 MG TABLET
ORAL_TABLET | Freq: Two times a day (BID) | ORAL | 0 refills | 90.00000 days | Status: CP
Start: 2023-11-17 — End: 2024-02-15
  Filled 2023-11-17: qty 180, 90d supply, fill #0

## 2023-11-17 MED ORDER — TAMSULOSIN 0.4 MG CAPSULE
ORAL_CAPSULE | Freq: Every day | ORAL | 0 refills | 30.00000 days | Status: CP
Start: 2023-11-17 — End: 2023-12-17
  Filled 2023-11-17: qty 30, 30d supply, fill #0

## 2023-11-17 MED ORDER — LYSINE HCL 500 MG TABLET
ORAL_TABLET | Freq: Two times a day (BID) | ORAL | 0 refills | 30.00000 days | Status: CP
Start: 2023-11-17 — End: 2023-12-17

## 2023-11-17 MED ORDER — PHENAZOPYRIDINE 200 MG TABLET
ORAL_TABLET | Freq: Three times a day (TID) | ORAL | 0 refills | 2.00000 days | Status: CP | PRN
Start: 2023-11-17 — End: 2023-11-19
  Filled 2023-11-17: qty 6, 2d supply, fill #0

## 2023-11-17 MED ORDER — CARVEDILOL 12.5 MG TABLET
ORAL_TABLET | Freq: Two times a day (BID) | ORAL | 0 refills | 90.00000 days | Status: CP
Start: 2023-11-17 — End: 2024-02-15
  Filled 2023-11-17: qty 180, 90d supply, fill #0

## 2023-11-17 MED ORDER — TROSPIUM 20 MG TABLET
ORAL_TABLET | Freq: Two times a day (BID) | ORAL | 0 refills | 14.00000 days | Status: CP
Start: 2023-11-17 — End: 2023-12-01
  Filled 2023-11-17: qty 28, 14d supply, fill #0

## 2023-11-17 MED ORDER — ACETAMINOPHEN 500 MG TABLET
ORAL_TABLET | Freq: Three times a day (TID) | ORAL | 0 refills | 7.00000 days | Status: CP
Start: 2023-11-17 — End: 2023-11-24
  Filled 2023-11-17: qty 42, 7d supply, fill #0

## 2023-11-17 MED ORDER — MELOXICAM 7.5 MG TABLET
ORAL_TABLET | Freq: Every day | ORAL | 11 refills | 30.00000 days | Status: CP
Start: 2023-11-17 — End: 2024-11-16
  Filled 2023-11-17: qty 30, 30d supply, fill #0

## 2023-11-17 MED ORDER — LIDOCAINE 2 % MUCOSAL JELLY IN APPLICATOR
URETHRAL | 6 refills | 0.00000 days | Status: CP | PRN
Start: 2023-11-17 — End: ?
  Filled 2023-11-17: qty 90, 5d supply, fill #0

## 2023-11-17 MED ORDER — CELECOXIB 200 MG CAPSULE
ORAL_CAPSULE | Freq: Two times a day (BID) | ORAL | 0 refills | 5.00000 days | Status: CN
Start: 2023-11-17 — End: 2023-11-22

## 2023-11-24 ENCOUNTER — Ambulatory Visit: Admit: 2023-11-24 | Discharge: 2023-11-25 | Payer: PRIVATE HEALTH INSURANCE

## 2023-11-24 DIAGNOSIS — N400 Enlarged prostate without lower urinary tract symptoms: Principal | ICD-10-CM

## 2023-11-29 ENCOUNTER — Encounter: Admit: 2023-11-29 | Discharge: 2023-11-29 | Payer: PRIVATE HEALTH INSURANCE

## 2023-11-29 DIAGNOSIS — N3001 Acute cystitis with hematuria: Principal | ICD-10-CM

## 2023-11-29 DIAGNOSIS — D508 Other iron deficiency anemias: Principal | ICD-10-CM

## 2023-11-29 MED ORDER — NITROFURANTOIN MONOHYDRATE/MACROCRYSTALS 100 MG CAPSULE
ORAL_CAPSULE | Freq: Two times a day (BID) | ORAL | 0 refills | 7.00000 days | Status: CP
Start: 2023-11-29 — End: 2023-12-06

## 2023-11-29 MED ORDER — PHENAZOPYRIDINE 200 MG TABLET
ORAL_TABLET | Freq: Three times a day (TID) | ORAL | 0 refills | 3.00000 days | Status: CP | PRN
Start: 2023-11-29 — End: 2023-12-02

## 2023-12-01 DIAGNOSIS — N400 Enlarged prostate without lower urinary tract symptoms: Principal | ICD-10-CM

## 2023-12-01 MED ORDER — CEFDINIR 300 MG CAPSULE
ORAL_CAPSULE | Freq: Two times a day (BID) | ORAL | 0 refills | 7.00000 days | Status: CP
Start: 2023-12-01 — End: 2023-12-08

## 2024-01-29 ENCOUNTER — Encounter
Admit: 2024-01-29 | Discharge: 2024-01-29 | Payer: PRIVATE HEALTH INSURANCE | Attending: Sports Medicine | Primary: Sports Medicine

## 2024-01-29 MED ORDER — OMEPRAZOLE 20 MG CAPSULE,DELAYED RELEASE
ORAL_CAPSULE | Freq: Two times a day (BID) | ORAL | 3 refills | 90.00000 days | Status: CP
Start: 2024-01-29 — End: 2025-01-28

## 2024-02-11 ENCOUNTER — Inpatient Hospital Stay: Admit: 2024-02-11 | Discharge: 2024-02-12 | Payer: PRIVATE HEALTH INSURANCE

## 2024-02-24 ENCOUNTER — Ambulatory Visit: Admit: 2024-02-24 | Discharge: 2024-02-25 | Payer: PRIVATE HEALTH INSURANCE

## 2024-02-24 DIAGNOSIS — N400 Enlarged prostate without lower urinary tract symptoms: Principal | ICD-10-CM

## 2024-02-24 MED ORDER — MIRABEGRON ER 25 MG TABLET,EXTENDED RELEASE 24 HR
ORAL_TABLET | Freq: Every day | ORAL | 2 refills | 30.00000 days | Status: CP
Start: 2024-02-24 — End: 2024-05-24

## 2024-03-01 DIAGNOSIS — R1013 Epigastric pain: Principal | ICD-10-CM

## 2024-03-01 DIAGNOSIS — R1310 Dysphagia, unspecified: Principal | ICD-10-CM

## 2024-03-01 DIAGNOSIS — K21 Gastroesophageal reflux disease with esophagitis, unspecified whether hemorrhage: Principal | ICD-10-CM

## 2024-03-29 ENCOUNTER — Encounter
Admit: 2024-03-29 | Discharge: 2024-03-29 | Payer: PRIVATE HEALTH INSURANCE | Attending: Student in an Organized Health Care Education/Training Program | Primary: Student in an Organized Health Care Education/Training Program

## 2024-03-29 DIAGNOSIS — R6813 Apparent life threatening event in infant (ALTE): Principal | ICD-10-CM

## 2024-03-29 DIAGNOSIS — G609 Hereditary and idiopathic neuropathy, unspecified: Principal | ICD-10-CM

## 2024-03-29 DIAGNOSIS — R531 Weakness: Principal | ICD-10-CM

## 2024-03-31 ENCOUNTER — Ambulatory Visit
Admit: 2024-03-31 | Discharge: 2024-03-31 | Payer: PRIVATE HEALTH INSURANCE | Attending: Orthopedic | Primary: Orthopedic

## 2024-03-31 ENCOUNTER — Inpatient Hospital Stay: Admit: 2024-03-31 | Discharge: 2024-03-31 | Payer: PRIVATE HEALTH INSURANCE

## 2024-03-31 DIAGNOSIS — Z981 Arthrodesis status: Principal | ICD-10-CM

## 2024-03-31 DIAGNOSIS — M5416 Radiculopathy, lumbar region: Principal | ICD-10-CM

## 2024-03-31 MED ORDER — PREGABALIN 50 MG CAPSULE
ORAL_CAPSULE | Freq: Two times a day (BID) | ORAL | 1 refills | 30.00000 days | Status: CP
Start: 2024-03-31 — End: 2024-05-30

## 2024-03-31 MED ORDER — METHYLPREDNISOLONE 4 MG TABLETS IN A DOSE PACK
ORAL | 0 refills | 0.00000 days | Status: CP
Start: 2024-03-31 — End: ?

## 2024-04-15 ENCOUNTER — Ambulatory Visit: Admit: 2024-04-15 | Discharge: 2024-04-16 | Payer: PRIVATE HEALTH INSURANCE

## 2024-04-15 DIAGNOSIS — N400 Enlarged prostate without lower urinary tract symptoms: Principal | ICD-10-CM

## 2024-04-15 MED ORDER — MIRABEGRON ER 50 MG TABLET,EXTENDED RELEASE 24 HR
ORAL_TABLET | Freq: Every day | ORAL | 2 refills | 30.00000 days | Status: CP
Start: 2024-04-15 — End: 2024-07-14

## 2024-04-29 DIAGNOSIS — R109 Unspecified abdominal pain: Principal | ICD-10-CM

## 2024-04-29 DIAGNOSIS — R1310 Dysphagia, unspecified: Principal | ICD-10-CM

## 2024-04-29 DIAGNOSIS — K279 Peptic ulcer, site unspecified, unspecified as acute or chronic, without hemorrhage or perforation: Principal | ICD-10-CM

## 2024-04-29 DIAGNOSIS — K21 Gastroesophageal reflux disease with esophagitis, unspecified whether hemorrhage: Principal | ICD-10-CM

## 2024-04-29 DIAGNOSIS — R1013 Epigastric pain: Principal | ICD-10-CM

## 2024-04-29 MED ORDER — LANSOPRAZOLE 30 MG DELAYED RELEASE,DISINTEGRATING TABLET
ORAL_TABLET | Freq: Two times a day (BID) | 3 refills | 30.00000 days | Status: CP
Start: 2024-04-29 — End: ?

## 2024-04-29 MED ORDER — SENNOSIDES 8.6 MG TABLET
ORAL_TABLET | Freq: Every evening | ORAL | 11 refills | 30.00000 days | Status: CP
Start: 2024-04-29 — End: 2025-04-29

## 2024-04-29 MED ORDER — SUCRALFATE 100 MG/ML ORAL SUSPENSION
Freq: Four times a day (QID) | ORAL | 3 refills | 90.00000 days | Status: CP
Start: 2024-04-29 — End: 2025-04-29

## 2024-05-02 ENCOUNTER — Encounter
Admit: 2024-05-02 | Discharge: 2024-05-02 | Payer: PRIVATE HEALTH INSURANCE | Attending: Sports Medicine | Primary: Sports Medicine

## 2024-05-02 MED ORDER — TADALAFIL 5 MG TABLET
ORAL_TABLET | Freq: Every morning | ORAL | 3 refills | 90.00000 days | Status: CP
Start: 2024-05-02 — End: 2025-05-02

## 2024-05-02 MED ORDER — TRAZODONE 50 MG TABLET
ORAL_TABLET | Freq: Every evening | ORAL | 0 refills | 30.00000 days | Status: CP
Start: 2024-05-02 — End: 2024-06-01

## 2024-05-02 MED ORDER — LEVOTHYROXINE 112 MCG TABLET
ORAL_TABLET | Freq: Every day | ORAL | 3 refills | 90.00000 days | Status: CP
Start: 2024-05-02 — End: 2025-05-02

## 2024-05-02 MED ORDER — CARVEDILOL 6.25 MG TABLET
ORAL_TABLET | Freq: Two times a day (BID) | ORAL | 3 refills | 90.00000 days | Status: CP
Start: 2024-05-02 — End: 2025-05-02

## 2024-05-02 MED ORDER — LISINOPRIL 10 MG TABLET
ORAL_TABLET | Freq: Every day | ORAL | 3 refills | 90.00000 days | Status: CP
Start: 2024-05-02 — End: 2025-05-02

## 2024-05-02 MED ORDER — EZETIMIBE 10 MG TABLET
ORAL_TABLET | Freq: Every day | ORAL | 3 refills | 90.00000 days | Status: CP
Start: 2024-05-02 — End: 2025-05-02

## 2024-05-02 MED ORDER — LANSOPRAZOLE 30 MG DELAYED RELEASE,DISINTEGRATING TABLET
ORAL_TABLET | Freq: Two times a day (BID) | 3 refills | 30.00000 days | Status: CP
Start: 2024-05-02 — End: ?

## 2024-05-03 DIAGNOSIS — I10 Essential (primary) hypertension: Principal | ICD-10-CM

## 2024-05-03 MED ORDER — LISINOPRIL 10 MG TABLET
ORAL_TABLET | Freq: Every day | ORAL | 3 refills | 90.00000 days | Status: CP
Start: 2024-05-03 — End: 2025-05-03

## 2024-05-11 ENCOUNTER — Ambulatory Visit: Admit: 2024-05-11 | Discharge: 2024-05-12 | Payer: PRIVATE HEALTH INSURANCE

## 2024-05-11 DIAGNOSIS — N400 Enlarged prostate without lower urinary tract symptoms: Principal | ICD-10-CM

## 2024-05-12 ENCOUNTER — Inpatient Hospital Stay: Admit: 2024-05-12 | Discharge: 2024-05-12 | Payer: PRIVATE HEALTH INSURANCE

## 2024-05-12 ENCOUNTER — Ambulatory Visit
Admit: 2024-05-12 | Discharge: 2024-05-12 | Payer: PRIVATE HEALTH INSURANCE | Attending: Orthopedic | Primary: Orthopedic

## 2024-05-13 DIAGNOSIS — K21 Gastroesophageal reflux disease with esophagitis, unspecified whether hemorrhage: Principal | ICD-10-CM

## 2024-05-13 DIAGNOSIS — R1310 Dysphagia, unspecified: Principal | ICD-10-CM

## 2024-05-13 MED ORDER — SUCRALFATE 100 MG/ML ORAL SUSPENSION
Freq: Four times a day (QID) | ORAL | 3 refills | 90.00000 days | Status: CP
Start: 2024-05-13 — End: 2025-05-13

## 2024-05-17 DIAGNOSIS — R1310 Dysphagia, unspecified: Principal | ICD-10-CM

## 2024-05-17 DIAGNOSIS — K21 Gastroesophageal reflux disease with esophagitis, unspecified whether hemorrhage: Principal | ICD-10-CM

## 2024-05-19 ENCOUNTER — Inpatient Hospital Stay: Admit: 2024-05-19 | Discharge: 2024-05-19 | Payer: PRIVATE HEALTH INSURANCE

## 2024-05-25 DIAGNOSIS — K21 Gastroesophageal reflux disease with esophagitis, unspecified whether hemorrhage: Principal | ICD-10-CM

## 2024-05-25 MED ORDER — SUCRALFATE 1 GRAM TABLET
ORAL_TABLET | Freq: Four times a day (QID) | ORAL | 2 refills | 30.00000 days | Status: CP
Start: 2024-05-25 — End: 2024-08-23

## 2024-05-26 ENCOUNTER — Encounter: Admit: 2024-05-26 | Discharge: 2024-05-26 | Payer: PRIVATE HEALTH INSURANCE

## 2024-05-26 ENCOUNTER — Inpatient Hospital Stay: Admit: 2024-05-26 | Discharge: 2024-05-26 | Payer: PRIVATE HEALTH INSURANCE

## 2024-05-28 DIAGNOSIS — G479 Sleep disorder, unspecified: Principal | ICD-10-CM

## 2024-05-28 MED ORDER — TRAZODONE 50 MG TABLET
ORAL_TABLET | Freq: Every evening | ORAL | 0 refills | 0.00000 days
Start: 2024-05-28 — End: ?

## 2024-05-31 MED ORDER — TRAZODONE 50 MG TABLET
ORAL_TABLET | Freq: Every evening | ORAL | 0 refills | 30.00000 days | Status: CP
Start: 2024-05-31 — End: ?
